# Patient Record
Sex: Female | Born: 1970 | Race: White | Hispanic: No | Marital: Married | Smoking: Never smoker
Health system: Southern US, Community
[De-identification: ages and names within clinical notes are randomized; demographics above are authoritative.]

## PROBLEM LIST (undated history)

## (undated) DIAGNOSIS — F1020 Alcohol dependence, uncomplicated: Secondary | ICD-10-CM

## (undated) DIAGNOSIS — E7212 Methylenetetrahydrofolate reductase deficiency: Secondary | ICD-10-CM

## (undated) DIAGNOSIS — E079 Disorder of thyroid, unspecified: Secondary | ICD-10-CM

## (undated) DIAGNOSIS — N951 Menopausal and female climacteric states: Secondary | ICD-10-CM

## (undated) DIAGNOSIS — W57XXXA Bitten or stung by nonvenomous insect and other nonvenomous arthropods, initial encounter: Secondary | ICD-10-CM

## (undated) DIAGNOSIS — F329 Major depressive disorder, single episode, unspecified: Secondary | ICD-10-CM

## (undated) DIAGNOSIS — R5382 Chronic fatigue, unspecified: Secondary | ICD-10-CM

## (undated) DIAGNOSIS — F419 Anxiety disorder, unspecified: Secondary | ICD-10-CM

## (undated) DIAGNOSIS — E7210 Disorders of sulfur-bearing amino-acid metabolism, unspecified: Secondary | ICD-10-CM

## (undated) HISTORY — DX: Chronic fatigue, unspecified: R53.82

## (undated) HISTORY — DX: Disorder of thyroid, unspecified: E07.9

## (undated) HISTORY — DX: Anxiety disorder, unspecified: F41.9

## (undated) HISTORY — DX: Alcohol dependence, uncomplicated: F10.20

## (undated) HISTORY — DX: Major depressive disorder, single episode, unspecified: F32.9

## (undated) HISTORY — DX: Disorders of sulfur-bearing amino-acid metabolism, unspecified: E72.10

## (undated) HISTORY — DX: Bitten or stung by nonvenomous insect and other nonvenomous arthropods, initial encounter: W57.XXXA

## (undated) HISTORY — DX: Menopausal and female climacteric states: N95.1

## (undated) HISTORY — DX: Methylenetetrahydrofolate reductase deficiency: E72.12

---
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
## 2001-09-28 ENCOUNTER — Other Ambulatory Visit: Admission: RE | Admit: 2001-09-28 | Discharge: 2001-09-28 | Payer: Self-pay | Admitting: Obstetrics and Gynecology

## 2001-10-11 HISTORY — PX: WISDOM TOOTH EXTRACTION: SHX21

## 2002-05-04 ENCOUNTER — Other Ambulatory Visit: Admission: RE | Admit: 2002-05-04 | Discharge: 2002-05-04 | Payer: Self-pay | Admitting: Obstetrics and Gynecology

## 2002-10-11 HISTORY — PX: ESSURE TUBAL LIGATION: SUR464

## 2002-10-16 ENCOUNTER — Other Ambulatory Visit: Admission: RE | Admit: 2002-10-16 | Discharge: 2002-10-16 | Payer: Self-pay | Admitting: Obstetrics and Gynecology

## 2003-03-14 ENCOUNTER — Other Ambulatory Visit: Admission: RE | Admit: 2003-03-14 | Discharge: 2003-03-14 | Payer: Self-pay | Admitting: Obstetrics and Gynecology

## 2003-04-08 ENCOUNTER — Ambulatory Visit (HOSPITAL_COMMUNITY): Admission: RE | Admit: 2003-04-08 | Discharge: 2003-04-08 | Payer: Self-pay | Admitting: *Deleted

## 2003-07-05 ENCOUNTER — Ambulatory Visit (HOSPITAL_COMMUNITY): Admission: RE | Admit: 2003-07-05 | Discharge: 2003-07-05 | Payer: Self-pay | Admitting: *Deleted

## 2003-07-05 ENCOUNTER — Encounter: Payer: Self-pay | Admitting: *Deleted

## 2004-07-27 ENCOUNTER — Other Ambulatory Visit: Admission: RE | Admit: 2004-07-27 | Discharge: 2004-07-27 | Payer: Self-pay | Admitting: Family Medicine

## 2007-10-12 HISTORY — PX: NOVASURE ABLATION: SHX5394

## 2009-10-11 HISTORY — PX: CHOLECYSTECTOMY: SHX55

## 2011-04-13 ENCOUNTER — Other Ambulatory Visit: Payer: Self-pay | Admitting: Obstetrics and Gynecology

## 2012-04-27 ENCOUNTER — Other Ambulatory Visit: Payer: Self-pay | Admitting: Obstetrics and Gynecology

## 2012-04-27 DIAGNOSIS — Z1231 Encounter for screening mammogram for malignant neoplasm of breast: Secondary | ICD-10-CM

## 2012-05-11 ENCOUNTER — Ambulatory Visit: Payer: Self-pay

## 2012-05-16 ENCOUNTER — Ambulatory Visit (INDEPENDENT_AMBULATORY_CARE_PROVIDER_SITE_OTHER): Payer: BC Managed Care – PPO

## 2012-05-16 DIAGNOSIS — Z1231 Encounter for screening mammogram for malignant neoplasm of breast: Secondary | ICD-10-CM

## 2012-10-11 HISTORY — PX: FINGER SURGERY: SHX640

## 2012-12-22 DIAGNOSIS — F32A Depression, unspecified: Secondary | ICD-10-CM

## 2012-12-22 HISTORY — DX: Depression, unspecified: F32.A

## 2013-04-27 ENCOUNTER — Other Ambulatory Visit: Payer: Self-pay | Admitting: Obstetrics and Gynecology

## 2013-04-27 DIAGNOSIS — Z1231 Encounter for screening mammogram for malignant neoplasm of breast: Secondary | ICD-10-CM

## 2013-05-22 ENCOUNTER — Ambulatory Visit (INDEPENDENT_AMBULATORY_CARE_PROVIDER_SITE_OTHER): Payer: BC Managed Care – PPO

## 2013-05-22 DIAGNOSIS — Z1231 Encounter for screening mammogram for malignant neoplasm of breast: Secondary | ICD-10-CM

## 2013-05-22 DIAGNOSIS — R928 Other abnormal and inconclusive findings on diagnostic imaging of breast: Secondary | ICD-10-CM

## 2013-05-24 ENCOUNTER — Other Ambulatory Visit: Payer: Self-pay | Admitting: Obstetrics and Gynecology

## 2013-05-24 DIAGNOSIS — R928 Other abnormal and inconclusive findings on diagnostic imaging of breast: Secondary | ICD-10-CM

## 2013-06-06 ENCOUNTER — Ambulatory Visit
Admission: RE | Admit: 2013-06-06 | Discharge: 2013-06-06 | Disposition: A | Discharge status: Home / Self Care | Payer: BC Managed Care – PPO | Source: Ambulatory Visit | Attending: Obstetrics and Gynecology | Admitting: Obstetrics and Gynecology

## 2013-06-06 ENCOUNTER — Other Ambulatory Visit: Payer: Self-pay | Admitting: Obstetrics and Gynecology

## 2013-06-06 DIAGNOSIS — R928 Other abnormal and inconclusive findings on diagnostic imaging of breast: Secondary | ICD-10-CM

## 2013-06-12 ENCOUNTER — Other Ambulatory Visit: Payer: BC Managed Care – PPO

## 2013-06-12 ENCOUNTER — Other Ambulatory Visit: Payer: Self-pay | Admitting: Obstetrics and Gynecology

## 2013-06-12 ENCOUNTER — Ambulatory Visit
Admission: RE | Admit: 2013-06-12 | Discharge: 2013-06-12 | Disposition: A | Discharge status: Home / Self Care | Payer: BC Managed Care – PPO | Source: Ambulatory Visit | Attending: Obstetrics and Gynecology | Admitting: Obstetrics and Gynecology

## 2013-06-12 DIAGNOSIS — R928 Other abnormal and inconclusive findings on diagnostic imaging of breast: Secondary | ICD-10-CM

## 2013-06-13 ENCOUNTER — Ambulatory Visit
Admission: RE | Admit: 2013-06-13 | Discharge: 2013-06-13 | Disposition: A | Discharge status: Home / Self Care | Payer: BC Managed Care – PPO | Source: Ambulatory Visit | Attending: Obstetrics and Gynecology | Admitting: Obstetrics and Gynecology

## 2013-06-13 DIAGNOSIS — R928 Other abnormal and inconclusive findings on diagnostic imaging of breast: Secondary | ICD-10-CM

## 2013-06-19 ENCOUNTER — Encounter (INDEPENDENT_AMBULATORY_CARE_PROVIDER_SITE_OTHER): Payer: Self-pay | Admitting: General Surgery

## 2013-06-19 ENCOUNTER — Ambulatory Visit (INDEPENDENT_AMBULATORY_CARE_PROVIDER_SITE_OTHER): Payer: BC Managed Care – PPO | Admitting: General Surgery

## 2013-06-19 VITALS — BP 110/68 | HR 64 | Resp 14 | Ht 67.0 in | Wt 148.0 lb

## 2013-06-19 DIAGNOSIS — N63 Unspecified lump in unspecified breast: Secondary | ICD-10-CM

## 2013-06-19 DIAGNOSIS — N6310 Unspecified lump in the right breast, unspecified quadrant: Secondary | ICD-10-CM

## 2013-06-19 NOTE — Progress Notes (Signed)
Patient ID: Amber Barajas, female   DOB: 1970/12/20, 42 y.o.   MRN: [id number]  Chief Complaint  Patient presents with  . New Evaluation    eval rt breast discordant    HPI Amber Barajas is a 42 y.o. female.  We are asked to see the patient in consultation by Dr. Judyann Munson to evaluate her for a right breast mass. The patient is a 42 year old white female who recently went for a routine screening mammogram. At that time she was not having any breast pain or discharge from the nipple. An abnormality was found in the 6:00 position of the right breast. This was suspicious appearing. It was biopsied and came back as fibrocystic disease. The radiologist felt this finding was discordant. She has no personal or family history of breast issues.  HPI  Past Medical History  Diagnosis Date  . Thyroid disease     Past Surgical History  Procedure Laterality Date  . Cholecystectomy    . Novasure ablation    . Essure tubal ligation      Family History  Problem Relation Age of Onset  . Cancer Father     lung  . Cancer Paternal Uncle     brain    Social History History  Substance Use Topics  . Smoking status: Never Smoker   . Smokeless tobacco: Never Used  . Alcohol Use: No    Allergies  Allergen Reactions  . Penicillins Hives and Itching    Current Outpatient Prescriptions  Medication Sig Dispense Refill  . folic acid (FOLVITE) 400 MCG tablet Take 800 mcg by mouth daily.      . Multiple Vitamins-Minerals (MULTIVITAMIN WITH MINERALS) tablet Take 1 tablet by mouth daily.      Marland Kitchen NATURE-THROID 81.25 MG TABS       . thiamine 50 MG tablet Take 50 mg by mouth daily.      . Vitamin D, Ergocalciferol, (DRISDOL) 50000 UNITS CAPS capsule Take 50,000 Units by mouth.      . vitamin E 400 UNIT capsule Take 400 Units by mouth daily.       No current facility-administered medications for this visit.    Review of Systems Review of Systems  Constitutional: Negative.   HENT: Negative.    Eyes: Negative.   Respiratory: Negative.   Cardiovascular: Negative.   Gastrointestinal: Negative.   Endocrine: Negative.   Genitourinary: Negative.   Musculoskeletal: Negative.   Skin: Negative.   Allergic/Immunologic: Negative.   Neurological: Negative.   Hematological: Negative.   Psychiatric/Behavioral: Negative.     Blood pressure 110/68, pulse 64, resp. rate 14, height 5\' 7"  (1.702 m), weight 148 lb (67.132 kg).  Physical Exam Physical Exam  Constitutional: She is oriented to person, place, and time. She appears well-developed and well-nourished.  HENT:  Head: Normocephalic and atraumatic.  Eyes: Conjunctivae and EOM are normal. Pupils are equal, round, and reactive to light.  Neck: Normal range of motion. Neck supple.  Cardiovascular: Normal rate, regular rhythm and normal heart sounds.   Pulmonary/Chest: Effort normal and breath sounds normal.  There is a palpable 1.5 cm mass in the 6:00 position the right breast it is mobile. There is no palpable mass in the left breast. She does have dense breast tissue bilaterally. There is no palpable axillary, cervical, or supraclavicular lymphadenopathy.  Abdominal: Soft. Bowel sounds are normal. She exhibits no mass. There is no tenderness.  Musculoskeletal: Normal range of motion.  Lymphadenopathy:    She has no cervical adenopathy.  Neurological: She is alert and oriented to person, place, and time.  Skin: Skin is warm and dry.  Psychiatric: She has a normal mood and affect. Her behavior is normal.    Data Reviewed As above  Assessment    The patient has a palpable mass in the 6:00 position of the right breast that has a worrisome appearance but a benign diagnosis on core needle biopsy. Because of the discord and so the results the recommendation would be to have this lump removed to make sure definitively that it is not a breast cancer. I've discussed with her in detail the risks and benefits of the operation to do this as  well as some of the technical aspects and she understands and wishes to proceed     Plan    Plan for right breast lumpectomy        TOTH III,Ramzey Petrovic S 06/19/2013, 12:29 PM

## 2013-06-19 NOTE — Patient Instructions (Signed)
Plan for right breast lumpectomy 

## 2013-06-28 ENCOUNTER — Inpatient Hospital Stay: Admission: RE | Admit: 2013-06-28 | Payer: BC Managed Care – PPO | Source: Ambulatory Visit

## 2013-07-09 LAB — HM PAP SMEAR: HM PAP: NORMAL

## 2013-07-27 ENCOUNTER — Encounter (HOSPITAL_BASED_OUTPATIENT_CLINIC_OR_DEPARTMENT_OTHER): Payer: Self-pay | Admitting: *Deleted

## 2013-07-27 NOTE — Progress Notes (Signed)
No labs needed

## 2013-08-01 ENCOUNTER — Ambulatory Visit (HOSPITAL_BASED_OUTPATIENT_CLINIC_OR_DEPARTMENT_OTHER)
Admission: RE | Admit: 2013-08-01 | Discharge: 2013-08-01 | Disposition: A | Discharge status: Home / Self Care | Payer: BC Managed Care – PPO | Source: Ambulatory Visit | Attending: General Surgery | Admitting: General Surgery

## 2013-08-01 ENCOUNTER — Encounter (HOSPITAL_BASED_OUTPATIENT_CLINIC_OR_DEPARTMENT_OTHER): Payer: Self-pay | Admitting: *Deleted

## 2013-08-01 ENCOUNTER — Encounter (HOSPITAL_BASED_OUTPATIENT_CLINIC_OR_DEPARTMENT_OTHER)
Admission: RE | Disposition: A | Discharge status: Home / Self Care | Payer: Self-pay | Source: Ambulatory Visit | Attending: General Surgery

## 2013-08-01 ENCOUNTER — Encounter (HOSPITAL_BASED_OUTPATIENT_CLINIC_OR_DEPARTMENT_OTHER): Payer: BC Managed Care – PPO | Admitting: Anesthesiology

## 2013-08-01 ENCOUNTER — Ambulatory Visit (HOSPITAL_BASED_OUTPATIENT_CLINIC_OR_DEPARTMENT_OTHER): Payer: BC Managed Care – PPO | Admitting: Anesthesiology

## 2013-08-01 DIAGNOSIS — N6089 Other benign mammary dysplasias of unspecified breast: Secondary | ICD-10-CM

## 2013-08-01 DIAGNOSIS — R920 Mammographic microcalcification found on diagnostic imaging of breast: Secondary | ICD-10-CM

## 2013-08-01 DIAGNOSIS — N6019 Diffuse cystic mastopathy of unspecified breast: Secondary | ICD-10-CM

## 2013-08-01 DIAGNOSIS — Z79899 Other long term (current) drug therapy: Secondary | ICD-10-CM | POA: Insufficient documentation

## 2013-08-01 DIAGNOSIS — N6310 Unspecified lump in the right breast, unspecified quadrant: Secondary | ICD-10-CM

## 2013-08-01 DIAGNOSIS — E079 Disorder of thyroid, unspecified: Secondary | ICD-10-CM | POA: Insufficient documentation

## 2013-08-01 HISTORY — PX: BREAST LUMPECTOMY: SHX2

## 2013-08-01 SURGERY — BREAST LUMPECTOMY
Anesthesia: General | Site: Breast | Laterality: Right | Wound class: Clean

## 2013-08-01 MED ORDER — LIDOCAINE HCL (CARDIAC) 20 MG/ML IV SOLN
INTRAVENOUS | Status: DC | PRN
  Administered 2013-08-01: 80 mg via INTRAVENOUS

## 2013-08-01 MED ORDER — HYDROMORPHONE HCL PF 1 MG/ML IJ SOLN
0.2500 mg | INTRAMUSCULAR | Status: DC | PRN
  Administered 2013-08-01 (×2): 0.5 mg via INTRAVENOUS

## 2013-08-01 MED ORDER — OXYCODONE HCL 5 MG PO TABS
ORAL_TABLET | ORAL | Status: AC
  Filled 2013-08-01: qty 1

## 2013-08-01 MED ORDER — MIDAZOLAM HCL 2 MG/2ML IJ SOLN
INTRAMUSCULAR | Status: AC
  Filled 2013-08-01: qty 2

## 2013-08-01 MED ORDER — ONDANSETRON HCL 4 MG/2ML IJ SOLN
INTRAMUSCULAR | Status: DC | PRN
  Administered 2013-08-01: 4 mg via INTRAMUSCULAR

## 2013-08-01 MED ORDER — CHLORHEXIDINE GLUCONATE 4 % EX LIQD: 1.0000 "application " | Freq: Once | CUTANEOUS | Status: DC

## 2013-08-01 MED ORDER — MIDAZOLAM HCL 5 MG/5ML IJ SOLN
INTRAMUSCULAR | Status: DC | PRN
  Administered 2013-08-01: 2 mg via INTRAVENOUS

## 2013-08-01 MED ORDER — PROPOFOL 10 MG/ML IV BOLUS
INTRAVENOUS | Status: DC | PRN
  Administered 2013-08-01 (×2): 20 mg via INTRAVENOUS
  Administered 2013-08-01: 150 mg via INTRAVENOUS
  Administered 2013-08-01: 20 mg via INTRAVENOUS

## 2013-08-01 MED ORDER — OXYCODONE HCL 5 MG/5ML PO SOLN: 5.0000 mg | Freq: Once | ORAL | Status: AC | PRN

## 2013-08-01 MED ORDER — BUPIVACAINE HCL (PF) 0.25 % IJ SOLN
INTRAMUSCULAR | Status: DC | PRN
  Administered 2013-08-01: 10 mL

## 2013-08-01 MED ORDER — DEXAMETHASONE SODIUM PHOSPHATE 4 MG/ML IJ SOLN
INTRAMUSCULAR | Status: DC | PRN
  Administered 2013-08-01: 10 mg via INTRAVENOUS

## 2013-08-01 MED ORDER — FENTANYL CITRATE 0.05 MG/ML IJ SOLN: 50.0000 ug | Freq: Once | INTRAMUSCULAR | Status: DC

## 2013-08-01 MED ORDER — VANCOMYCIN HCL IN DEXTROSE 1-5 GM/200ML-% IV SOLN
1000.0000 mg | INTRAVENOUS | Status: AC
  Administered 2013-08-01: 1000 mg via INTRAVENOUS

## 2013-08-01 MED ORDER — FENTANYL CITRATE 0.05 MG/ML IJ SOLN
INTRAMUSCULAR | Status: AC
  Filled 2013-08-01: qty 2

## 2013-08-01 MED ORDER — FENTANYL CITRATE 0.05 MG/ML IJ SOLN
INTRAMUSCULAR | Status: DC | PRN
  Administered 2013-08-01: 100 ug via INTRAVENOUS

## 2013-08-01 MED ORDER — FENTANYL CITRATE 0.05 MG/ML IJ SOLN: 50.0000 ug | INTRAMUSCULAR | Status: DC | PRN

## 2013-08-01 MED ORDER — MIDAZOLAM HCL 2 MG/2ML IJ SOLN: 1.0000 mg | INTRAMUSCULAR | Status: DC | PRN

## 2013-08-01 MED ORDER — OXYCODONE HCL 5 MG PO TABS
5.0000 mg | ORAL_TABLET | Freq: Once | ORAL | Status: AC | PRN
  Administered 2013-08-01: 5 mg via ORAL

## 2013-08-01 MED ORDER — BUPIVACAINE-EPINEPHRINE PF 0.25-1:200000 % IJ SOLN
INTRAMUSCULAR | Status: AC
  Filled 2013-08-01: qty 30

## 2013-08-01 MED ORDER — HYDROMORPHONE HCL PF 1 MG/ML IJ SOLN
INTRAMUSCULAR | Status: AC
  Filled 2013-08-01: qty 1

## 2013-08-01 MED ORDER — BUPIVACAINE HCL (PF) 0.25 % IJ SOLN
INTRAMUSCULAR | Status: AC
  Filled 2013-08-01: qty 30

## 2013-08-01 MED ORDER — HYDROCODONE-ACETAMINOPHEN 5-325 MG PO TABS: 1.0000 | ORAL_TABLET | ORAL | Status: DC | PRN

## 2013-08-01 MED ORDER — VANCOMYCIN HCL IN DEXTROSE 1-5 GM/200ML-% IV SOLN
INTRAVENOUS | Status: AC
  Filled 2013-08-01: qty 200

## 2013-08-01 MED ORDER — 0.9 % SODIUM CHLORIDE (POUR BTL) OPTIME
TOPICAL | Status: DC | PRN
  Administered 2013-08-01: 100 mL

## 2013-08-01 MED ORDER — PROMETHAZINE HCL 25 MG/ML IJ SOLN: 6.2500 mg | INTRAMUSCULAR | Status: DC | PRN

## 2013-08-01 MED ORDER — LACTATED RINGERS IV SOLN
INTRAVENOUS | Status: DC
  Administered 2013-08-01: 08:00:00 via INTRAVENOUS

## 2013-08-01 MED ORDER — BUPIVACAINE-EPINEPHRINE PF 0.5-1:200000 % IJ SOLN
INTRAMUSCULAR | Status: AC
  Filled 2013-08-01: qty 30

## 2013-08-01 SURGICAL SUPPLY — 41 items
ADH SKN CLS APL DERMABOND .7 (GAUZE/BANDAGES/DRESSINGS) ×1
BLADE SURG 10 STRL SS (BLADE) ×2 IMPLANT
BLADE SURG 15 STRL LF DISP TIS (BLADE) ×1 IMPLANT
BLADE SURG 15 STRL SS (BLADE) ×2
CANISTER SUCT 1200ML W/VALVE (MISCELLANEOUS) ×2 IMPLANT
CHLORAPREP W/TINT 26ML (MISCELLANEOUS) ×2 IMPLANT
CLIP TI WIDE RED SMALL 6 (CLIP) IMPLANT
COVER MAYO STAND STRL (DRAPES) ×2 IMPLANT
COVER TABLE BACK 60X90 (DRAPES) ×2 IMPLANT
DECANTER SPIKE VIAL GLASS SM (MISCELLANEOUS) ×1 IMPLANT
DERMABOND ADVANCED (GAUZE/BANDAGES/DRESSINGS) ×1
DERMABOND ADVANCED .7 DNX12 (GAUZE/BANDAGES/DRESSINGS) ×1 IMPLANT
DEVICE DUBIN W/COMP PLATE 8390 (MISCELLANEOUS) IMPLANT
DRAPE LAPAROSCOPIC ABDOMINAL (DRAPES) ×2 IMPLANT
DRAPE UTILITY XL STRL (DRAPES) ×2 IMPLANT
ELECT COATED BLADE 2.86 ST (ELECTRODE) ×2 IMPLANT
ELECT REM PT RETURN 9FT ADLT (ELECTROSURGICAL) ×2
ELECTRODE REM PT RTRN 9FT ADLT (ELECTROSURGICAL) ×1 IMPLANT
GLOVE BIO SURGEON STRL SZ7.5 (GLOVE) ×2 IMPLANT
GLOVE BIOGEL PI IND STRL 7.0 (GLOVE) IMPLANT
GLOVE BIOGEL PI INDICATOR 7.0 (GLOVE) ×1
GLOVE ECLIPSE 6.5 STRL STRAW (GLOVE) ×2 IMPLANT
GOWN PREVENTION PLUS XLARGE (GOWN DISPOSABLE) ×4 IMPLANT
NDL HYPO 25X1 1.5 SAFETY (NEEDLE) ×1 IMPLANT
NEEDLE HYPO 25X1 1.5 SAFETY (NEEDLE) ×2 IMPLANT
NS IRRIG 1000ML POUR BTL (IV SOLUTION) ×2 IMPLANT
PACK BASIN DAY SURGERY FS (CUSTOM PROCEDURE TRAY) ×2 IMPLANT
PENCIL BUTTON HOLSTER BLD 10FT (ELECTRODE) ×2 IMPLANT
SLEEVE SCD COMPRESS KNEE MED (MISCELLANEOUS) ×2 IMPLANT
SPONGE LAP 18X18 X RAY DECT (DISPOSABLE) ×2 IMPLANT
STAPLER VISISTAT 35W (STAPLE) IMPLANT
SUT MON AB 4-0 PC3 18 (SUTURE) ×2 IMPLANT
SUT SILK 2 0 SH (SUTURE) ×2 IMPLANT
SUT VIC AB 3-0 54X BRD REEL (SUTURE) IMPLANT
SUT VIC AB 3-0 BRD 54 (SUTURE)
SUT VICRYL 3-0 CR8 SH (SUTURE) ×2 IMPLANT
SYR CONTROL 10ML LL (SYRINGE) ×2 IMPLANT
TOWEL OR 17X24 6PK STRL BLUE (TOWEL DISPOSABLE) ×2 IMPLANT
TOWEL OR NON WOVEN STRL DISP B (DISPOSABLE) ×2 IMPLANT
TUBE CONNECTING 20X1/4 (TUBING) ×2 IMPLANT
YANKAUER SUCT BULB TIP NO VENT (SUCTIONS) ×2 IMPLANT

## 2013-08-01 NOTE — Op Note (Signed)
08/01/2013  9:21 AM  PATIENT:  Amber Barajas  42 y.o. female  PRE-OPERATIVE DIAGNOSIS:  right breast mass   POST-OPERATIVE DIAGNOSIS:  right breast mass   PROCEDURE:  Procedure(s): LUMPECTOMY (Right)  SURGEON:  Surgeon(s) and Role:    * Robyne Askew, MD - Primary  PHYSICIAN ASSISTANT:   ASSISTANTS: none   ANESTHESIA:   general  EBL:     BLOOD ADMINISTERED:none  DRAINS: none   LOCAL MEDICATIONS USED:  MARCAINE     SPECIMEN:  Source of Specimen:  right breast tissue  DISPOSITION OF SPECIMEN:  PATHOLOGY  COUNTS:  YES  TOURNIQUET:  * No tourniquets in log *  DICTATION: .Dragon Dictation After informed consent was obtained the patient was brought to the operating room placed in the supine position on the operating room table. After adequate induction of general anesthesia the patient's right breast was prepped with ChloraPrep, allowed to dry, and draped in usual sterile manner. The patient has a palpable mass in the 6:00 position of the right breast that was needle biopsied but the results were discordant from the radiologic findings. Because of this the decision was made to remove the mass. A radial type incision was made with a 15 blade knife overlying the mass. This incision was carried through the skin and subcutaneous tissue sharply with electrocautery. Once into the breast tissue the mass could be palpated very easily. A circular portion of breast tissue was excised sharply around the mass using the electrocautery. Once the mass was removed it was oriented with a short stitch on the superior surface of the long stitch on the lateral surface. The mass was then sent to pathology for further evaluation. No other palpable masses were felt through the incision. Hemostasis was achieved using the Bovie electrocautery. The wound was infiltrated with quarter percent Marcaine. The deep layer the wound was then closed with interrupted 3-0 Vicryl stitches. The skin was then closed  with interrupted 4-0 Monocryl subcuticular stitches. Dermabond dressings were applied. The patient tolerated the procedure well. At the end of the case all needle sponge and instrument counts were correct. The patient was then awakened and taken to recovery in stable condition.  PLAN OF CARE: Discharge to home after PACU  PATIENT DISPOSITION:  PACU - hemodynamically stable.   Delay start of Pharmacological VTE agent (>24hrs) due to surgical blood loss or risk of bleeding: not applicable

## 2013-08-01 NOTE — Transfer of Care (Signed)
Immediate Anesthesia Transfer of Care Note  Patient: Amber Barajas  Procedure(s) Performed: Procedure(s): LUMPECTOMY (Right)  Patient Location: PACU  Anesthesia Type:General  Level of Consciousness: awake, sedated and patient cooperative  Airway & Oxygen Therapy: Patient Spontanous Breathing and Patient connected to face mask oxygen  Post-op Assessment: Report given to PACU RN and Post -op Vital signs reviewed and stable  Post vital signs: Reviewed and stable  Complications: No apparent anesthesia complications

## 2013-08-01 NOTE — Anesthesia Procedure Notes (Signed)
Procedure Name: LMA Insertion Date/Time: 08/01/2013 8:43 AM Performed by: Gar Gibbon Pre-anesthesia Checklist: Patient identified, Emergency Drugs available, Suction available and Patient being monitored Patient Re-evaluated:Patient Re-evaluated prior to inductionOxygen Delivery Method: Circle System Utilized Preoxygenation: Pre-oxygenation with 100% oxygen Intubation Type: IV induction Ventilation: Mask ventilation without difficulty LMA: LMA inserted LMA Size: 3.0 Number of attempts: 2 Airway Equipment and Method: bite block Placement Confirmation: positive ETCO2 Tube secured with: Tape Dental Injury: Teeth and Oropharynx as per pre-operative assessment

## 2013-08-01 NOTE — Anesthesia Postprocedure Evaluation (Signed)
  Anesthesia Post-op Note  Patient: Amber Barajas  Procedure(s) Performed: Procedure(s): LUMPECTOMY (Right)  Patient Location: PACU  Anesthesia Type:General  Level of Consciousness: awake and alert   Airway and Oxygen Therapy: Patient Spontanous Breathing  Post-op Pain: mild  Post-op Assessment: Post-op Vital signs reviewed, Patient's Cardiovascular Status Stable, Respiratory Function Stable, Patent Airway, No signs of Nausea or vomiting and Pain level controlled  Post-op Vital Signs: Reviewed and stable  Complications: No apparent anesthesia complications

## 2013-08-01 NOTE — Interval H&P Note (Signed)
History and Physical Interval Note:  08/01/2013 8:27 AM  Amber Barajas  has presented today for surgery, with the diagnosis of right breast mass   The various methods of treatment have been discussed with the patient and family. After consideration of risks, benefits and other options for treatment, the patient has consented to  Procedure(s): LUMPECTOMY (Right) as a surgical intervention .  The patient's history has been reviewed, patient examined, no change in status, stable for surgery.  I have reviewed the patient's chart and labs.  Questions were answered to the patient's satisfaction.     TOTH III,Merlyn Bollen S

## 2013-08-01 NOTE — H&P (Signed)
Amber Barajas  06/19/2013 11:30 AM   Office Visit  MRN:  [id number]   Description: 42 year old female  Provider: Robyne Askew, MD  Department: [facility]        Diagnoses    Breast mass, right    -  Primary    611.72      Reason for Visit    New Evaluation    eval rt breast discordant        Current Vitals - Last Recorded    BP Pulse Resp Ht Wt BMI    110/68 64 14 5\' 7"  (1.702 m) 148 lb (67.132 kg) 23.17 kg/m2       LMP              07/11/2013                 Progress Notes    Robyne Askew, MD at 06/19/2013 12:32 PM    Status: Signed                   Patient ID: Amber Barajas, female   DOB: July 27, 1971, 42 y.o.   MRN: [id number]    Chief Complaint   Patient presents with   .  New Evaluation       eval rt breast discordant        HPI Amber Barajas is a 42 y.o. female.  We are asked to see the patient in consultation by Dr. Judyann Munson to evaluate her for a right breast mass. The patient is a 42 year old white female who recently went for a routine screening mammogram. At that time she was not having any breast pain or discharge from the nipple. An abnormality was found in the 6:00 position of the right breast. This was suspicious appearing. It was biopsied and came back as fibrocystic disease. The radiologist felt this finding was discordant. She has no personal or family history of breast issues.  HPI    Past Medical History   Diagnosis  Date   .  Thyroid disease           Past Surgical History   Procedure  Laterality  Date   .  Cholecystectomy       .  Novasure ablation       .  Essure tubal ligation             Family History   Problem  Relation  Age of Onset   .  Cancer  Father         lung   .  Cancer  Paternal Uncle         brain        Social History History   Substance Use Topics   .  Smoking status:  Never Smoker    .  Smokeless tobacco:  Never Used   .  Alcohol Use:  No         Allergies   Allergen   Reactions   .  Penicillins  Hives and Itching         Current Outpatient Prescriptions   Medication  Sig  Dispense  Refill   .  folic acid (FOLVITE) 400 MCG tablet  Take 800 mcg by mouth daily.         .  Multiple Vitamins-Minerals (MULTIVITAMIN WITH MINERALS) tablet  Take 1 tablet by mouth daily.         Marland Kitchen  NATURE-THROID 81.25 MG TABS           .  thiamine 50 MG tablet  Take 50 mg by mouth daily.         .  Vitamin D, Ergocalciferol, (DRISDOL) 50000 UNITS CAPS capsule  Take 50,000 Units by mouth.         .  vitamin E 400 UNIT capsule  Take 400 Units by mouth daily.             No current facility-administered medications for this visit.        Review of Systems Review of Systems  Constitutional: Negative.   HENT: Negative.   Eyes: Negative.   Respiratory: Negative.   Cardiovascular: Negative.   Gastrointestinal: Negative.   Endocrine: Negative.   Genitourinary: Negative.   Musculoskeletal: Negative.   Skin: Negative.   Allergic/Immunologic: Negative.   Neurological: Negative.   Hematological: Negative.   Psychiatric/Behavioral: Negative.       Blood pressure 110/68, pulse 64, resp. rate 14, height 5\' 7"  (1.702 m), weight 148 lb (67.132 kg).   Physical Exam Physical Exam  Constitutional: She is oriented to person, place, and time. She appears well-developed and well-nourished.  HENT:   Head: Normocephalic and atraumatic.  Eyes: Conjunctivae and EOM are normal. Pupils are equal, round, and reactive to light.  Neck: Normal range of motion. Neck supple.  Cardiovascular: Normal rate, regular rhythm and normal heart sounds.   Pulmonary/Chest: Effort normal and breath sounds normal.  There is a palpable 1.5 cm mass in the 6:00 position the right breast it is mobile. There is no palpable mass in the left breast. She does have dense breast tissue bilaterally. There is no palpable axillary, cervical, or supraclavicular lymphadenopathy.  Abdominal: Soft. Bowel sounds are  normal. She exhibits no mass. There is no tenderness.  Musculoskeletal: Normal range of motion.  Lymphadenopathy:    She has no cervical adenopathy.  Neurological: She is alert and oriented to person, place, and time.  Skin: Skin is warm and dry.  Psychiatric: She has a normal mood and affect. Her behavior is normal.      Data Reviewed As above   Assessment    The patient has a palpable mass in the 6:00 position of the right breast that has a worrisome appearance but a benign diagnosis on core needle biopsy. Because of the discord and so the results the recommendation would be to have this lump removed to make sure definitively that it is not a breast cancer. I've discussed with her in detail the risks and benefits of the operation to do this as well as some of the technical aspects and she understands and wishes to proceed      Plan    Plan for right breast lumpectomy

## 2013-08-01 NOTE — Anesthesia Preprocedure Evaluation (Addendum)
Anesthesia Evaluation  Patient identified by MRN, date of birth, ID band Patient awake    Reviewed: Allergy & Precautions, H&P , NPO status , Patient's Chart, lab work & pertinent test results  Airway Mallampati: I TM Distance: >3 FB Neck ROM: Full    Dental   Pulmonary  breath sounds clear to auscultation        Cardiovascular Rhythm:Regular Rate:Normal     Neuro/Psych    GI/Hepatic   Endo/Other    Renal/GU      Musculoskeletal   Abdominal   Peds  Hematology   Anesthesia Other Findings   Reproductive/Obstetrics                           Anesthesia Physical Anesthesia Plan  ASA: I  Anesthesia Plan: General   Post-op Pain Management:    Induction:   Airway Management Planned: LMA  Additional Equipment:   Intra-op Plan:   Post-operative Plan: Extubation in OR  Informed Consent: I have reviewed the patients History and Physical, chart, labs and discussed the procedure including the risks, benefits and alternatives for the proposed anesthesia with the patient or authorized representative who has indicated his/her understanding and acceptance.     Plan Discussed with: CRNA and Surgeon  Anesthesia Plan Comments:         Anesthesia Quick Evaluation

## 2013-08-03 ENCOUNTER — Encounter (HOSPITAL_BASED_OUTPATIENT_CLINIC_OR_DEPARTMENT_OTHER): Payer: Self-pay | Admitting: General Surgery

## 2013-08-06 ENCOUNTER — Telehealth (INDEPENDENT_AMBULATORY_CARE_PROVIDER_SITE_OTHER): Payer: Self-pay

## 2013-08-06 NOTE — Telephone Encounter (Signed)
Pt called and was given pathology results.

## 2013-08-08 ENCOUNTER — Telehealth (INDEPENDENT_AMBULATORY_CARE_PROVIDER_SITE_OTHER): Payer: Self-pay

## 2013-08-08 NOTE — Telephone Encounter (Signed)
Pt called back stating she was told Dr Carolynne Edouard would call her with more information about her pathology report. I told her path showed a sclerosing lesion with fibrocystic changes. Advised it was benign tissue and that there was nothing more surgically to do. She asked if there would be any further follow up besides her post op appointment. Advised not at this time but she would need her regular mammograms. Advised Dr Carolynne Edouard will go over all this at her appointment.

## 2013-08-16 ENCOUNTER — Other Ambulatory Visit: Payer: Self-pay

## 2013-08-22 ENCOUNTER — Encounter (INDEPENDENT_AMBULATORY_CARE_PROVIDER_SITE_OTHER): Payer: Self-pay | Admitting: General Surgery

## 2013-08-22 ENCOUNTER — Ambulatory Visit (INDEPENDENT_AMBULATORY_CARE_PROVIDER_SITE_OTHER): Payer: BC Managed Care – PPO | Admitting: General Surgery

## 2013-08-22 VITALS — BP 124/72 | HR 76 | Resp 16 | Ht 67.0 in | Wt 153.8 lb

## 2013-08-22 DIAGNOSIS — N63 Unspecified lump in unspecified breast: Secondary | ICD-10-CM

## 2013-08-22 DIAGNOSIS — N6310 Unspecified lump in the right breast, unspecified quadrant: Secondary | ICD-10-CM

## 2013-08-22 NOTE — Patient Instructions (Signed)
Continue regular self exams  

## 2013-08-22 NOTE — Progress Notes (Signed)
Subjective:     Patient ID: Amber Barajas, female   DOB: July 16, 1971, 42 y.o.   MRN: [id number]  HPI The patient is a 42 year old white female who is about 3 weeks status post right lumpectomy. 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 She tolerated the surgery well and has no complaints today. She denies any pain.  Review of Systems     Objective:   Physical Exam On exam her right breast incision in the 6:00 position is healing nicely with no sign of infection or significant  seroma    Assessment:     The patient is 3 weeks status post right lumpectomy for benign disease     Plan:     At this point she will continue to do regular self exams. She will continue to followup with her medical doctor yearly and get a yearly mammogram. We will plan to see her back on a when necessary basis. I have offered to have her see Dr. Park Breed and the [facility] but is not interested at this time.

## 2013-12-20 ENCOUNTER — Ambulatory Visit (INDEPENDENT_AMBULATORY_CARE_PROVIDER_SITE_OTHER): Payer: BC Managed Care – PPO

## 2013-12-20 ENCOUNTER — Encounter: Payer: Self-pay | Admitting: Sports Medicine

## 2013-12-20 ENCOUNTER — Ambulatory Visit (INDEPENDENT_AMBULATORY_CARE_PROVIDER_SITE_OTHER): Payer: BC Managed Care – PPO | Admitting: Sports Medicine

## 2013-12-20 VITALS — BP 114/73 | HR 80 | Ht 67.0 in | Wt 163.0 lb

## 2013-12-20 DIAGNOSIS — M25561 Pain in right knee: Secondary | ICD-10-CM

## 2013-12-20 DIAGNOSIS — M25569 Pain in unspecified knee: Secondary | ICD-10-CM

## 2013-12-20 DIAGNOSIS — M1711 Unilateral primary osteoarthritis, right knee: Secondary | ICD-10-CM | POA: Insufficient documentation

## 2013-12-20 MED ORDER — MELOXICAM 15 MG PO TABS: ORAL_TABLET | ORAL | Status: DC

## 2013-12-20 NOTE — Assessment & Plan Note (Signed)
For over a decade now, moderate, persistent, wakes her up from sleep, with morning jgelling. She does have pain over the lateral femoral condyle and lateral joint line without mechanical symptoms suggestive of a degenerative meniscal injury as well. She does desire aggressive treatment, injection as above, for physical therapy, Mobic, x-rays. Return to see me in one month.

## 2013-12-20 NOTE — Progress Notes (Signed)
   Subjective:    I'm seeing this patient as a consultation for:  Amber Ghent, PA-C with [facility]  CC: Right knee pain  HPI: Amber Barajas is a very pleasant 43 year old female, for the past decade or 2 she's had pain that she localizes under the kneecap and along the lateral joint line. Pain is moderate, persistent, it is stiff in the mornings, and it keeps her from exercising. She denies any trauma.  Past medical history, Surgical history, Family history not pertinant except as noted below, Social history, Allergies, and medications have been entered into the medical record, reviewed, and no changes needed.   Review of Systems: No headache, visual changes, nausea, vomiting, diarrhea, constipation, dizziness, abdominal pain, skin rash, fevers, chills, night sweats, weight loss, swollen lymph nodes, body aches, joint swelling, muscle aches, chest pain, shortness of breath, mood changes, visual or auditory hallucinations.   Objective:   General: Well Developed, well nourished, and in no acute distress.  Neuro/Psych: Alert and oriented x3, extra-ocular muscles intact, able to move all 4 extremities, sensation grossly intact. Skin: Warm and dry, no rashes noted.  Respiratory: Not using accessory muscles, speaking in full sentences, trachea midline.  Cardiovascular: Pulses palpable, no extremity edema. Abdomen: Does not appear distended. Right Knee: Normal to inspection with no erythema or effusion or obvious bony abnormalities. Tender palpation along the medial and lateral joint lines. ROM full in flexion and extension and lower leg rotation. Ligaments with solid consistent endpoints including ACL, PCL, LCL, MCL. Negative Mcmurray's, Apley's, and Thessalonian tests. Non painful patellar compression. Patellar glide without crepitus. Patellar and quadriceps tendons unremarkable. Hamstring and quadriceps strength is normal.   On x-rays, Amber Barajas view, there does appear  to be mildly decreased joint space in the medial compartment of the right knee compared to the left side.  Procedure: Real-time Ultrasound Guided Injection of right knee Device: GE Logiq E  Verbal informed consent obtained.  Time-out conducted.  Noted no overlying erythema, induration, or other signs of local infection.  Skin prepped in a sterile fashion.  Local anesthesia: Topical Ethyl chloride.  With sterile technique and under real time ultrasound guidance:  1 cc Kenalog 40, 4 cc lidocaine injected easily into the suprapatellar recess. Completed without difficulty  Pain immediately resolved suggesting accurate placement of the medication.  Advised to call if fevers/chills, erythema, induration, drainage, or persistent bleeding.  Images permanently stored and available for review in the ultrasound unit.  Impression: Technically successful ultrasound guided injection.  Impression and Recommendations:   This case required medical decision making of moderate complexity.

## 2013-12-25 ENCOUNTER — Ambulatory Visit (INDEPENDENT_AMBULATORY_CARE_PROVIDER_SITE_OTHER): Payer: BC Managed Care – PPO | Admitting: Physical Therapy

## 2013-12-25 DIAGNOSIS — M25569 Pain in unspecified knee: Secondary | ICD-10-CM

## 2013-12-28 ENCOUNTER — Encounter (INDEPENDENT_AMBULATORY_CARE_PROVIDER_SITE_OTHER): Payer: BC Managed Care – PPO | Admitting: Physical Therapy

## 2013-12-28 DIAGNOSIS — M25569 Pain in unspecified knee: Secondary | ICD-10-CM

## 2013-12-31 ENCOUNTER — Encounter (INDEPENDENT_AMBULATORY_CARE_PROVIDER_SITE_OTHER): Payer: BC Managed Care – PPO

## 2013-12-31 DIAGNOSIS — M25569 Pain in unspecified knee: Secondary | ICD-10-CM

## 2014-01-02 ENCOUNTER — Encounter (INDEPENDENT_AMBULATORY_CARE_PROVIDER_SITE_OTHER): Payer: BC Managed Care – PPO

## 2014-01-08 ENCOUNTER — Encounter (INDEPENDENT_AMBULATORY_CARE_PROVIDER_SITE_OTHER): Payer: BC Managed Care – PPO | Admitting: Physical Therapy

## 2014-01-08 DIAGNOSIS — M25569 Pain in unspecified knee: Secondary | ICD-10-CM

## 2014-01-15 ENCOUNTER — Encounter (INDEPENDENT_AMBULATORY_CARE_PROVIDER_SITE_OTHER): Payer: BC Managed Care – PPO | Admitting: Physical Therapy

## 2014-01-15 DIAGNOSIS — M25569 Pain in unspecified knee: Secondary | ICD-10-CM

## 2014-01-17 ENCOUNTER — Telehealth: Payer: Self-pay | Admitting: *Deleted

## 2014-01-17 ENCOUNTER — Encounter: Payer: Self-pay | Admitting: Sports Medicine

## 2014-01-17 ENCOUNTER — Ambulatory Visit (INDEPENDENT_AMBULATORY_CARE_PROVIDER_SITE_OTHER): Payer: BC Managed Care – PPO | Admitting: Sports Medicine

## 2014-01-17 VITALS — BP 108/71 | HR 75 | Ht 67.0 in | Wt 162.0 lb

## 2014-01-17 DIAGNOSIS — M25569 Pain in unspecified knee: Secondary | ICD-10-CM

## 2014-01-17 DIAGNOSIS — M25561 Pain in right knee: Secondary | ICD-10-CM

## 2014-01-17 NOTE — Progress Notes (Signed)
  Subjective:    CC: Followup  HPI: Amber Barajas returns to see me for her right knee pain. She continues to have pain that she localizes along the joint lines, as well as under the kneecap. She does get gelling when sitting for long periods of time. We injected her knee at last visit, and she had no response, not even temporary. She is currently doing physical therapy, her predominant discomfort is along the lateral joint line of the lateral upper thigh in the distribution of the IT band.  Past medical history, Surgical history, Family history not pertinant except as noted below, Social history, Allergies, and medications have been entered into the medical record, reviewed, and no changes needed.   Review of Systems: No fevers, chills, night sweats, weight loss, chest pain, or shortness of breath.   Objective:    General: Well Developed, well nourished, and in no acute distress.  Neuro: Alert and oriented x3, extra-ocular muscles intact, sensation grossly intact.  HEENT: Normocephalic, atraumatic, pupils equal round reactive to light, neck supple, no masses, no lymphadenopathy, thyroid nonpalpable.  Skin: Warm and dry, no rashes. Cardiac: Regular rate and rhythm, no murmurs rubs or gallops, no lower extremity edema.  Respiratory: Clear to auscultation bilaterally. Not using accessory muscles, speaking in full sentences. Right Knee: Normal to inspection with no erythema or effusion or obvious bony abnormalities. Tender to palpation along the medial femoral condyle anteriorly. ROM full in flexion and extension and lower leg rotation. Ligaments with solid consistent endpoints including ACL, PCL, LCL, MCL. Negative Mcmurray's, Apley's, and Thessalonian tests. Non painful patellar compression. Patellar glide without crepitus. Patellar and quadriceps tendons unremarkable. Hamstring and quadriceps strength is normal.  Positive Ober's test with reproduction of pain.  Impression and  Recommendations:

## 2014-01-17 NOTE — Assessment & Plan Note (Signed)
Persistent pain despite injection, no response, not even temporary. She did have a positive Ober's test suggestive of IT band tightness. I suggested some stretches for this. I would like to obtain an MRI, she is fairly tender to palpation over the anterior aspect of the medial femoral condyle. Return to see me for MRI results.

## 2014-01-17 NOTE — Telephone Encounter (Signed)
Precert obtained through [facility] online for MRI right knee w/o contrast.  Auth # [id number] good from 01/17/14 to 02/15/14.  [facility] notified. Clemetine Marker, LPN

## 2014-01-23 ENCOUNTER — Ambulatory Visit (INDEPENDENT_AMBULATORY_CARE_PROVIDER_SITE_OTHER): Payer: BC Managed Care – PPO

## 2014-01-23 ENCOUNTER — Encounter (INDEPENDENT_AMBULATORY_CARE_PROVIDER_SITE_OTHER): Payer: BC Managed Care – PPO | Admitting: Physical Therapy

## 2014-01-23 DIAGNOSIS — M25569 Pain in unspecified knee: Secondary | ICD-10-CM

## 2014-01-23 DIAGNOSIS — M25561 Pain in right knee: Secondary | ICD-10-CM

## 2014-02-04 ENCOUNTER — Encounter: Payer: Self-pay | Admitting: Sports Medicine

## 2014-02-04 ENCOUNTER — Ambulatory Visit (INDEPENDENT_AMBULATORY_CARE_PROVIDER_SITE_OTHER): Payer: BC Managed Care – PPO | Admitting: Sports Medicine

## 2014-02-04 VITALS — BP 132/79 | HR 80 | Ht 67.0 in | Wt 163.0 lb

## 2014-02-04 DIAGNOSIS — M1711 Unilateral primary osteoarthritis, right knee: Secondary | ICD-10-CM

## 2014-02-04 DIAGNOSIS — M1710 Unilateral primary osteoarthritis, unspecified knee: Secondary | ICD-10-CM

## 2014-02-04 DIAGNOSIS — IMO0002 Reserved for concepts with insufficient information to code with codable children: Secondary | ICD-10-CM

## 2014-02-04 MED ORDER — SODIUM HYALURONATE (VISCOSUP) 20 MG/2ML IX SOSY: PREFILLED_SYRINGE | INTRA_ARTICULAR | Status: DC

## 2014-02-04 NOTE — Progress Notes (Signed)
  Subjective:    CC: Followup  HPI:  Amber Barajas returns to follow up her difficult to treat right knee pain. Symptoms have been predominantly patellofemoral. I placed her through formal physical therapy, NSAIDs, we injected her knee, nothing has helped. More recently we obtained an MRI, the results of which be dictated below. Pain continues to be moderate, persistent, under the kneecap.  Past medical history, Surgical history, Family history not pertinant except as noted below, Social history, Allergies, and medications have been entered into the medical record, reviewed, and no changes needed.   Review of Systems: No fevers, chills, night sweats, weight loss, chest pain, or shortness of breath.   Objective:    General: Well Developed, well nourished, and in no acute distress.  Neuro: Alert and oriented x3, extra-ocular muscles intact, sensation grossly intact.  HEENT: Normocephalic, atraumatic, pupils equal round reactive to light, neck supple, no masses, no lymphadenopathy, thyroid nonpalpable.  Skin: Warm and dry, no rashes. Cardiac: Regular rate and rhythm, no murmurs rubs or gallops, no lower extremity edema.  Respiratory: Clear to auscultation bilaterally. Not using accessory muscles, speaking in full sentences.  MRI reviewed and showed irregular cartilage under the patella suggestive of patellofemoral chondromalacia, there is also some thickening of the medial patellofemoral ligament.  Impression and Recommendations:

## 2014-02-04 NOTE — Assessment & Plan Note (Signed)
Unfortunately unfortunately has failed physical therapy, steroid injection. MRI showed osteoarthritis under the kneecap. Hip abductors were strong. Continue aggressive home rehabilitation. We are going to start viscosupplementation with Euflexxa. Prescription given. Return for shots.

## 2014-02-14 ENCOUNTER — Ambulatory Visit (INDEPENDENT_AMBULATORY_CARE_PROVIDER_SITE_OTHER): Payer: BC Managed Care – PPO | Admitting: Sports Medicine

## 2014-02-14 ENCOUNTER — Encounter: Payer: Self-pay | Admitting: Sports Medicine

## 2014-02-14 VITALS — BP 116/71 | HR 75 | Ht 67.0 in | Wt 161.0 lb

## 2014-02-14 DIAGNOSIS — IMO0002 Reserved for concepts with insufficient information to code with codable children: Secondary | ICD-10-CM

## 2014-02-14 DIAGNOSIS — M1710 Unilateral primary osteoarthritis, unspecified knee: Secondary | ICD-10-CM

## 2014-02-14 DIAGNOSIS — M1711 Unilateral primary osteoarthritis, right knee: Secondary | ICD-10-CM

## 2014-02-14 NOTE — Progress Notes (Signed)
   Procedure: Real-time Ultrasound Guided Injection of right knee Device: GE Logiq E  Verbal informed consent obtained.  Time-out conducted.  Noted no overlying erythema, induration, or other signs of local infection.  Skin prepped in a sterile fashion.  Local anesthesia: Topical Ethyl chloride.  With sterile technique and under real time ultrasound guidance:  20 mg/2 mL of Euflexxa (sodium hyaluronate) in a prefilled syringe was injected easily into the knee through a 22-gauge needle. Completed without difficulty  Pain immediately resolved suggesting accurate placement of the medication.  Advised to call if fevers/chills, erythema, induration, drainage, or persistent bleeding.  Images permanently stored and available for review in the ultrasound unit.  Impression: Technically successful ultrasound guided injection.

## 2014-02-14 NOTE — Assessment & Plan Note (Addendum)
MRI does show some osteoarthritis, continue aggressive home rehabilitation. We are also starting Euflexxa. Return in one week for injection #2 of 3. Patient has supplied the syringes, and she will be keeping them.

## 2014-02-21 ENCOUNTER — Encounter: Payer: Self-pay | Admitting: Sports Medicine

## 2014-02-21 ENCOUNTER — Ambulatory Visit (INDEPENDENT_AMBULATORY_CARE_PROVIDER_SITE_OTHER): Payer: BC Managed Care – PPO | Admitting: Sports Medicine

## 2014-02-21 VITALS — BP 116/74 | HR 67 | Ht 67.0 in | Wt 160.0 lb

## 2014-02-21 DIAGNOSIS — M1710 Unilateral primary osteoarthritis, unspecified knee: Secondary | ICD-10-CM

## 2014-02-21 DIAGNOSIS — IMO0002 Reserved for concepts with insufficient information to code with codable children: Secondary | ICD-10-CM

## 2014-02-21 DIAGNOSIS — M1711 Unilateral primary osteoarthritis, right knee: Secondary | ICD-10-CM

## 2014-02-21 NOTE — Progress Notes (Signed)
   Procedure: Real-time Ultrasound Guided Injection of right knee Device: GE Logiq E  Verbal informed consent obtained.  Time-out conducted.  Noted no overlying erythema, induration, or other signs of local infection.  Skin prepped in a sterile fashion.  Local anesthesia: Topical Ethyl chloride.  With sterile technique and under real time ultrasound guidance:  20 mg/2 mL of Euflexxa (sodium hyaluronate) in a prefilled syringe was injected easily into the knee through a 25-gauge needle. Completed without difficulty  Pain immediately resolved suggesting accurate placement of the medication.  Advised to call if fevers/chills, erythema, induration, drainage, or persistent bleeding.  Images permanently stored and available for review in the ultrasound unit.  Impression: Technically successful ultrasound guided injection. 

## 2014-02-21 NOTE — Assessment & Plan Note (Signed)
Euflexxa injection #2 of 3 given today. Return in 1 week for number 3. Patient has supplied the syringes, and she will be keeping them.

## 2014-02-26 IMAGING — MG MM DIGITAL DIAGNOSTIC UNILAT*R*
2 series · 2 of 2 positions shown · non-contrast
Comparison: Previous exams.

CLINICAL DATA: Status post ultrasound-guided core biopsy of
distortion in the right breast.

DIGITAL DIAGNOSTIC RIGHT MAMMOGRAM

[R CC]
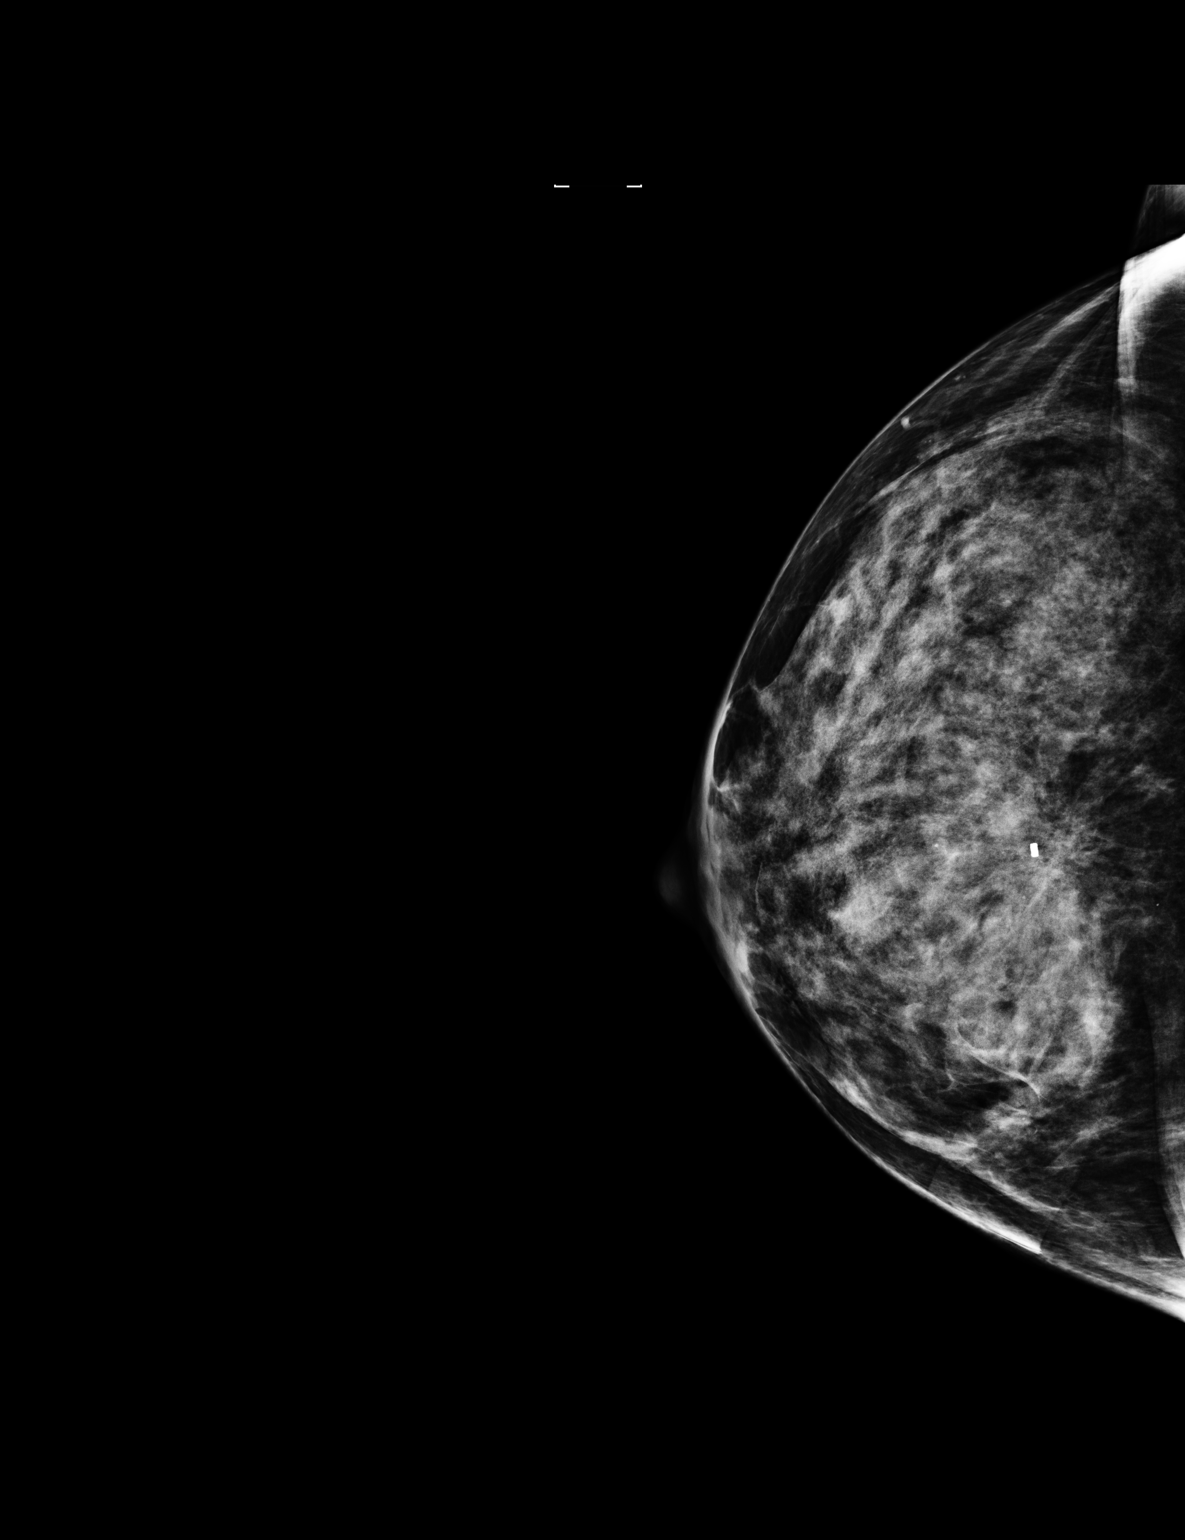

[R ML]
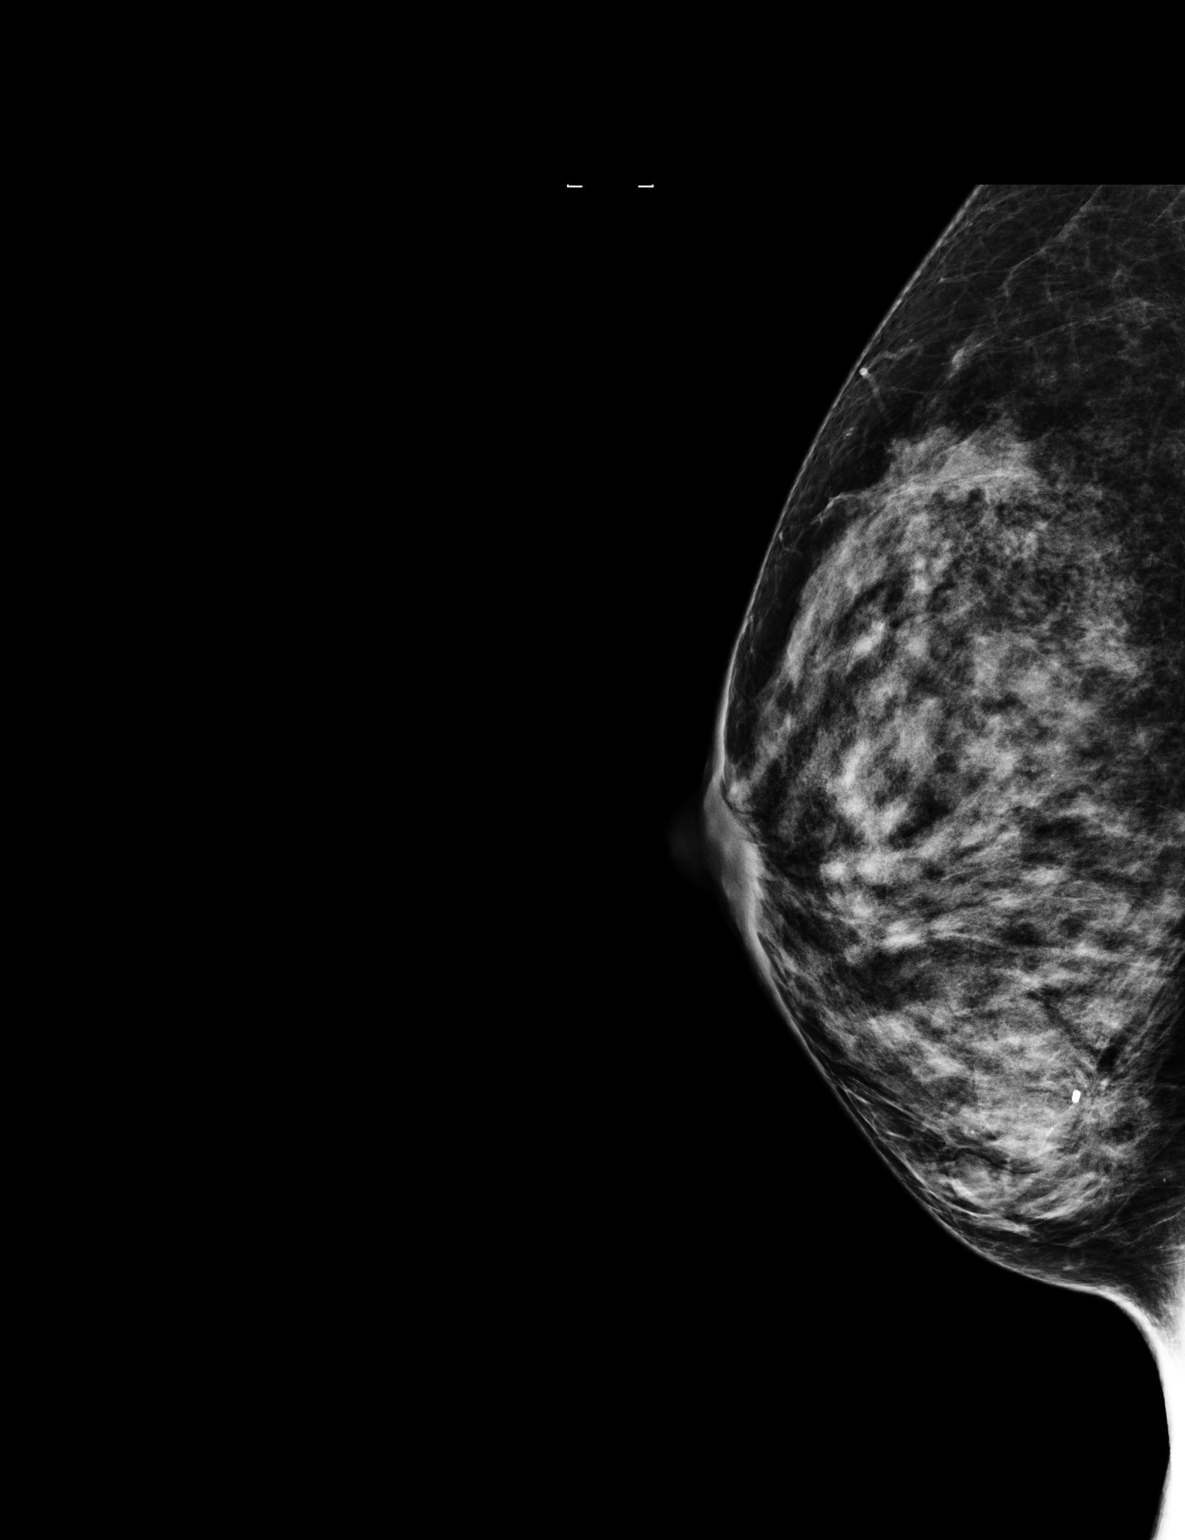

[2 of 2 positions shown; findings below may reference images not displayed]

FINDINGS: Mammographic images were obtained following ultrasound
guided biopsy of abnormal shadowing in the 6 o'clock region of the
right breast.  Mammographic images show there is a cylindrical
shaped clip in the distortion in the 6 o'clock region of the
breast..
IMPRESSION: Status post ultrasound-guided core biopsy of the right breast with
pathology pending.

Final Assessment:  Post Procedure Mammograms for Marker Placement

## 2014-02-28 ENCOUNTER — Ambulatory Visit (INDEPENDENT_AMBULATORY_CARE_PROVIDER_SITE_OTHER): Payer: BC Managed Care – PPO | Admitting: Sports Medicine

## 2014-02-28 ENCOUNTER — Encounter: Payer: Self-pay | Admitting: Sports Medicine

## 2014-02-28 VITALS — BP 124/83 | HR 76 | Ht 67.0 in | Wt 161.0 lb

## 2014-02-28 DIAGNOSIS — M1710 Unilateral primary osteoarthritis, unspecified knee: Secondary | ICD-10-CM

## 2014-02-28 DIAGNOSIS — M1711 Unilateral primary osteoarthritis, right knee: Secondary | ICD-10-CM

## 2014-02-28 DIAGNOSIS — IMO0002 Reserved for concepts with insufficient information to code with codable children: Secondary | ICD-10-CM

## 2014-02-28 NOTE — Assessment & Plan Note (Signed)
Euflexxa injection #3 today into the right knee. She is becoming pain-free. Start working extensively on strengthening exercises.

## 2014-02-28 NOTE — Progress Notes (Signed)
    Procedure: Real-time Ultrasound Guided Injection of right knee Device: GE Logiq E  Verbal informed consent obtained.  Time-out conducted.  Noted no overlying erythema, induration, or other signs of local infection.  Skin prepped in a sterile fashion.  Local anesthesia: Topical Ethyl chloride.  With sterile technique and under real time ultrasound guidance:  20 mg/2 mL of Euflexxa (sodium hyaluronate) in a prefilled syringe was injected easily into the knee through a 25-gauge needle. Completed without difficulty  Pain immediately resolved suggesting accurate placement of the medication.  Advised to call if fevers/chills, erythema, induration, drainage, or persistent bleeding.  Images permanently stored and available for review in the ultrasound unit.  Impression: Technically successful ultrasound guided injection.

## 2014-04-01 ENCOUNTER — Encounter: Payer: Self-pay | Admitting: Sports Medicine

## 2014-04-01 ENCOUNTER — Ambulatory Visit (INDEPENDENT_AMBULATORY_CARE_PROVIDER_SITE_OTHER): Payer: BC Managed Care – PPO | Admitting: Sports Medicine

## 2014-04-01 VITALS — BP 123/79 | HR 75 | Wt 162.0 lb

## 2014-04-01 DIAGNOSIS — M1711 Unilateral primary osteoarthritis, right knee: Secondary | ICD-10-CM

## 2014-04-01 DIAGNOSIS — M1710 Unilateral primary osteoarthritis, unspecified knee: Secondary | ICD-10-CM

## 2014-04-01 NOTE — Progress Notes (Signed)
  Subjective:    CC: Followup  HPI: Right knee osteoarthritis: Pain-free now after series of Euflexxa injections.  Past medical history, Surgical history, Family history not pertinant except as noted below, Social history, Allergies, and medications have been entered into the medical record, reviewed, and no changes needed.   Review of Systems: No fevers, chills, night sweats, weight loss, chest pain, or shortness of breath.   Objective:    General: Well Developed, well nourished, and in no acute distress.  Neuro: Alert and oriented x3, extra-ocular muscles intact, sensation grossly intact.  HEENT: Normocephalic, atraumatic, pupils equal round reactive to light, neck supple, no masses, no lymphadenopathy, thyroid nonpalpable.  Skin: Warm and dry, no rashes. Cardiac: Regular rate and rhythm, no murmurs rubs or gallops, no lower extremity edema.  Respiratory: Clear to auscultation bilaterally. Not using accessory muscles, speaking in full sentences. Right knee: Normal to inspection with no erythema or effusion or obvious bony abnormalities. Palpation normal with no warmth, joint line tenderness, patellar tenderness, or condyle tenderness. ROM full in flexion and extension and lower leg rotation. Ligaments with solid consistent endpoints including ACL, PCL, LCL, MCL. Negative Mcmurray's, Apley's, and Thessalonian tests. Non painful patellar compression. Patellar glide without crepitus. Patellar and quadriceps tendons unremarkable. Hamstring and quadriceps strength is normal.   Impression and Recommendations:

## 2014-04-01 NOTE — Assessment & Plan Note (Signed)
Pain-free after a series of Euflexxa. Return as needed.

## 2014-04-17 ENCOUNTER — Other Ambulatory Visit: Payer: Self-pay | Admitting: Obstetrics and Gynecology

## 2014-04-17 DIAGNOSIS — Z1231 Encounter for screening mammogram for malignant neoplasm of breast: Secondary | ICD-10-CM

## 2014-05-28 ENCOUNTER — Ambulatory Visit: Payer: BC Managed Care – PPO

## 2014-09-05 IMAGING — CR DG KNEE COMPLETE 4+V*R*
4 series · 4 of 4 positions shown · non-contrast
Comparison: None.

CLINICAL DATA: Right lateral knee pain for many years, no acute
injury

EXAM:
RIGHT KNEE - COMPLETE 4+ VIEW

[view not recorded (1 of 4)]
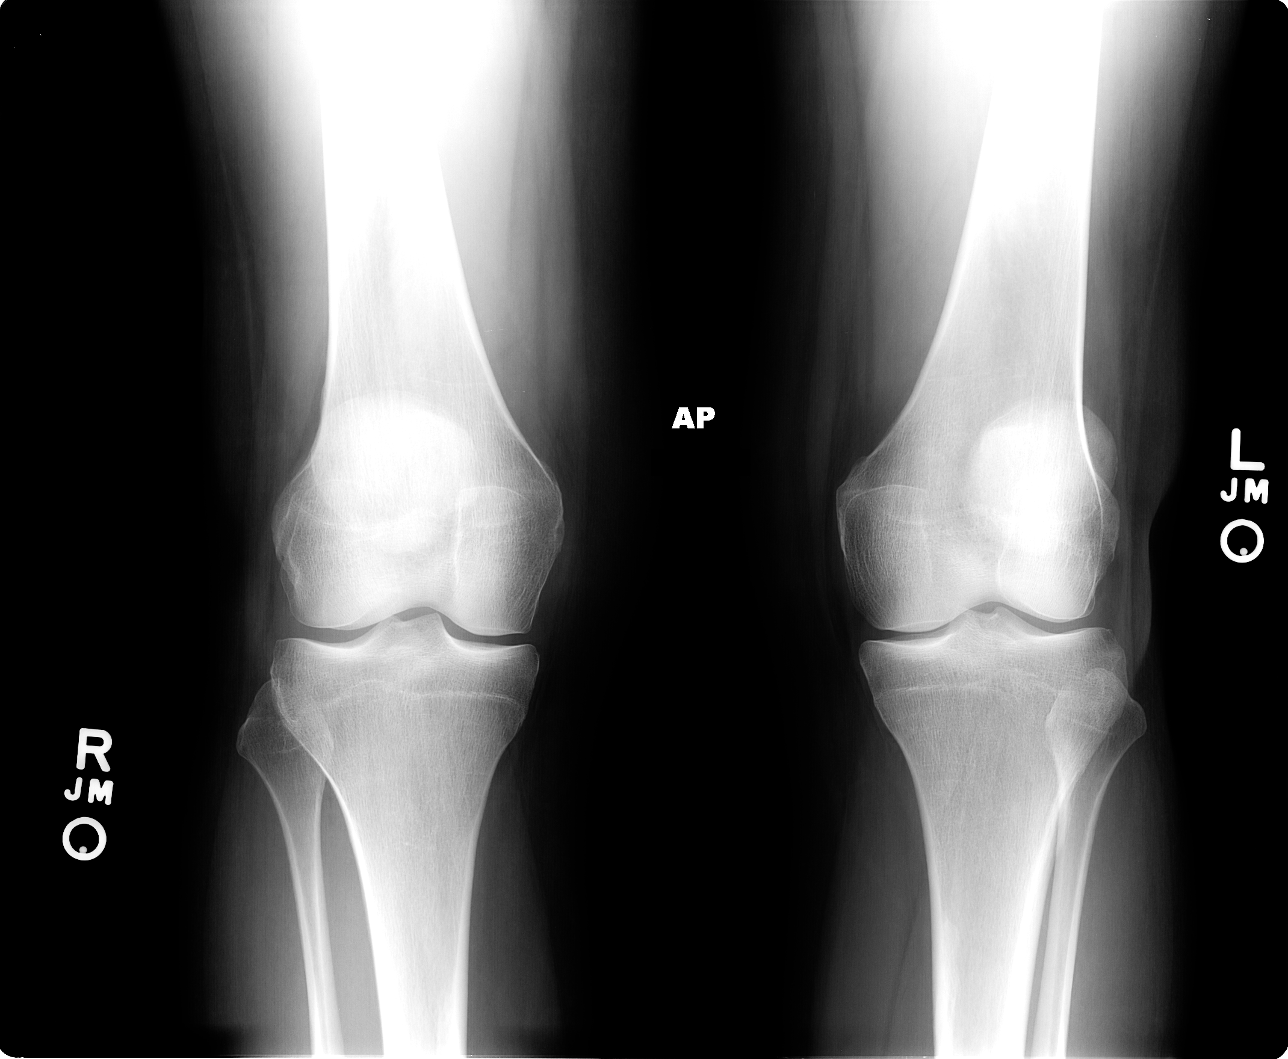

[view not recorded (2 of 4)]
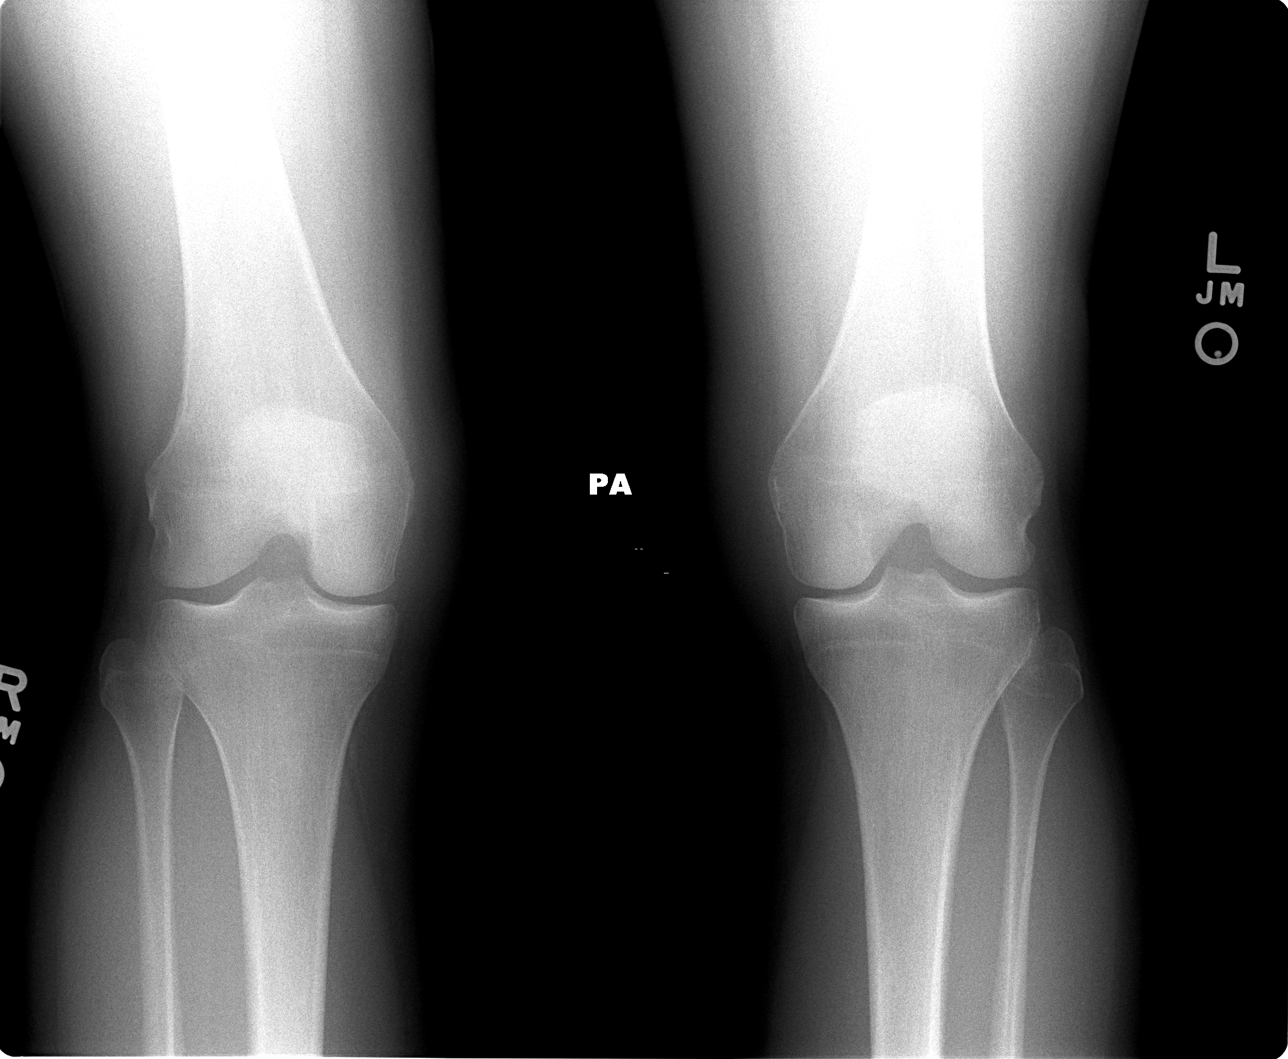

[view not recorded (3 of 4)]
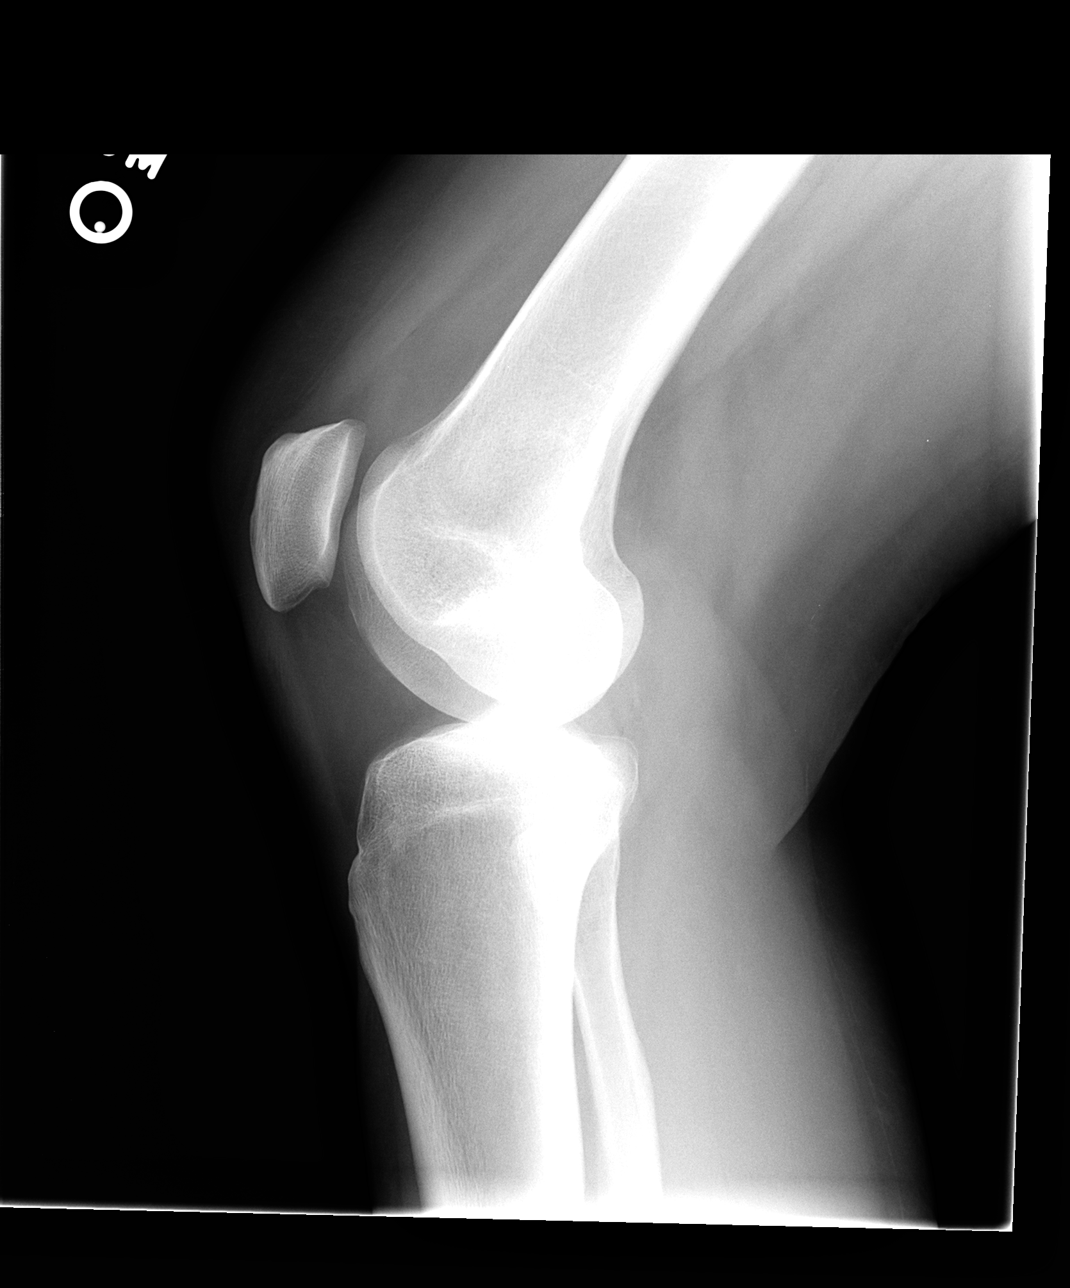

[view not recorded (4 of 4)]
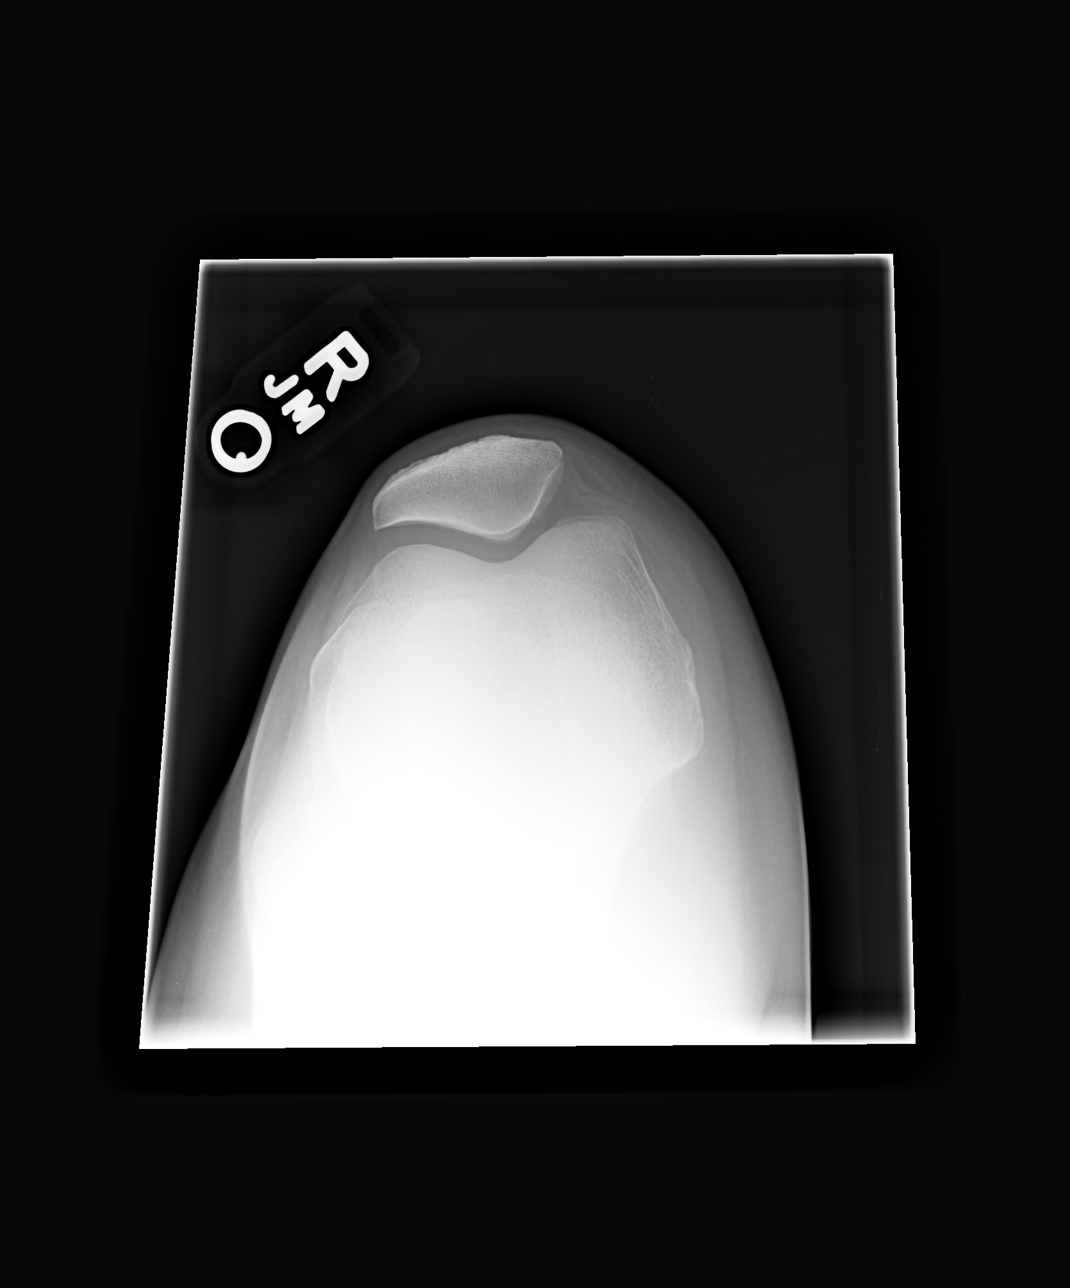

[4 of 4 positions shown; findings below may reference images not displayed]

FINDINGS: Standing views of both knees show relatively normal joint spaces. No
right knee joint effusion is seen. The right patella is normally
positioned within the patellofemoral groove.
IMPRESSION: Negative.

## 2014-11-20 DIAGNOSIS — IMO0001 Reserved for inherently not codable concepts without codable children: Secondary | ICD-10-CM

## 2014-11-20 DIAGNOSIS — F1020 Alcohol dependence, uncomplicated: Secondary | ICD-10-CM | POA: Insufficient documentation

## 2014-11-20 HISTORY — DX: Reserved for inherently not codable concepts without codable children: IMO0001

## 2015-03-19 DIAGNOSIS — Z1589 Genetic susceptibility to other disease: Secondary | ICD-10-CM

## 2015-03-19 HISTORY — DX: Genetic susceptibility to other disease: Z15.89

## 2015-06-20 LAB — LIPID PANEL
Cholesterol: 251 mg/dL — AB (ref 0–200)
HDL: 57 mg/dL (ref 35–70)
LDL Cholesterol: 138 mg/dL
Triglycerides: 280 mg/dL — AB (ref 40–160)

## 2015-06-20 LAB — BASIC METABOLIC PANEL
BUN: 12 mg/dL (ref 4–21)
Creatinine: 0.7 mg/dL (ref 0.5–1.1)
GLUCOSE: 83 mg/dL
POTASSIUM: 4.3 mmol/L (ref 3.4–5.3)
Sodium: 139 mmol/L (ref 137–147)

## 2015-06-20 LAB — HEMOGLOBIN A1C: HEMOGLOBIN A1C: 5.3 % (ref 4.0–6.0)

## 2015-06-20 LAB — HEPATIC FUNCTION PANEL
ALT: 16 U/L (ref 7–35)
AST: 19 U/L (ref 13–35)
Alkaline Phosphatase: 102 U/L (ref 25–125)
BILIRUBIN, TOTAL: 0.4 mg/dL

## 2015-07-08 DIAGNOSIS — R5382 Chronic fatigue, unspecified: Secondary | ICD-10-CM

## 2015-07-08 DIAGNOSIS — G9332 Myalgic encephalomyelitis/chronic fatigue syndrome: Secondary | ICD-10-CM

## 2015-07-08 DIAGNOSIS — W57XXXA Bitten or stung by nonvenomous insect and other nonvenomous arthropods, initial encounter: Secondary | ICD-10-CM

## 2015-07-08 HISTORY — DX: Myalgic encephalomyelitis/chronic fatigue syndrome: G93.32

## 2015-07-08 HISTORY — DX: Chronic fatigue, unspecified: R53.82

## 2015-07-08 HISTORY — DX: Bitten or stung by nonvenomous insect and other nonvenomous arthropods, initial encounter: W57.XXXA

## 2015-08-06 ENCOUNTER — Encounter: Payer: Self-pay | Admitting: Internal Medicine

## 2015-08-07 ENCOUNTER — Encounter: Payer: Self-pay | Admitting: Internal Medicine

## 2015-08-07 ENCOUNTER — Ambulatory Visit (INDEPENDENT_AMBULATORY_CARE_PROVIDER_SITE_OTHER): Payer: BLUE CROSS/BLUE SHIELD | Admitting: Internal Medicine

## 2015-08-07 VITALS — BP 118/72 | HR 89 | Temp 98.3°F | Resp 12 | Ht 67.75 in | Wt 178.6 lb

## 2015-08-07 DIAGNOSIS — R5382 Chronic fatigue, unspecified: Secondary | ICD-10-CM | POA: Diagnosis not present

## 2015-08-07 DIAGNOSIS — E039 Hypothyroidism, unspecified: Secondary | ICD-10-CM

## 2015-08-07 LAB — TSH: TSH: 0.09 u[IU]/mL — ABNORMAL LOW (ref 0.35–4.50)

## 2015-08-07 LAB — T4, FREE: FREE T4: 0.69 ng/dL (ref 0.60–1.60)

## 2015-08-07 LAB — T3, FREE: T3 FREE: 4.7 pg/mL — AB (ref 2.3–4.2)

## 2015-08-07 MED ORDER — THYROID 81.25 MG PO TABS: ORAL_TABLET | ORAL | Status: DC

## 2015-08-07 NOTE — Progress Notes (Signed)
Patient ID: Amber Barajas, female   DOB: 22-Mar-1971, 44 y.o.   MRN: [id number]   HPI  Amber Barajas is a 44 y.o.-year-old female, self-referred for management of hypothyroidism.  She has been diagnosed with hypothyroidism in 2012. She started to have sxs in 2008. She had weight gain, heavy periods (had endom. Ablation - now normal periods). She then saw an endocrinologist >> Rx'ed Synthroid. She did not feel good.  Patient has been seen at [facility], and she has been on Naturethroid 1.25 gr twice a day (equivalent levothyroxine dose of 250 g daily) at last visit on 07/08/2015. She was also on Lugol's iodine solution 2 drops daily (stopped). Of note, patient is on lithium orotate daily at bedtime - for improved sleep and relaxation (stopped).  She [facility], however, TSH levels have not been checked, her thyroid hormone dose adjustments were made based on free T3 and free T4 (!)  Reviewed the records from her [place]: Labs on 06/20/2015:   free T3 3.1 (2-4.4), free T4 0.77 (0.82-1.77), TPO antibodies 14 (0-34), ATA <1.0 (0-0.9) Previous labs on 02/14/2015:  Free T3 3.1 (2-4.4), free T4 0.76 (0.82-1.77)  She takes the thyroid hormones: - fasting and at 4 pm - with water - separated by >30 min from b'fast  - no calcium, iron, PPIs, multivitamins    Pt describes: - + weight gain (after starting "adrenal support") - 20 lbs in last year - + fatigue (she was diagnosed with chronic fatigue syndrome) - + hot intolerance - + palpitations - + anxiety/depression depression - + poor sleep - wakes up at 2 am - + diarrhea/ no constipation - no dry skin - no hair loss  Pt denies feeling nodules in neck, hoarseness, dysphagia/odynophagia, SOB with lying down.  She has no FH of thyroid disorders. No FH of thyroid cancer.  No h/o radiation tx to head or neck.  I reviewed her records and she  also has a h/o of: Anxiety (GAD), alcoholism - now with better control of cravings. 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 She is on methylated vitamins. She is on multiple other supplements.  She stopped: testosterone, pregnenolone, DHEA, and we have a new hormonal evaluation with her naturopath.  ROS: Constitutional: + see HPI Eyes: no blurry vision, no xerophthalmia ENT: no sore throat, no nodules palpated in throat, + dysphagia/no odynophagia, no hoarseness, + tinnitus Cardiovascular: no CP/SOB/+ palpitations/no leg swelling Respiratory: no cough/SOB Gastrointestinal: + N/+V/+D/no C Musculoskeletal: no muscle/joint aches Skin: no rashes, + easy bruising Neurological: no tremors/numbness/tingling/dizziness, + lightheadedness/vertigo Psychiatric: + depression/+ anxiety + low libido  Past Medical History  Diagnosis Date  . Thyroid disease   . Perimenopausal   . Depression 12/22/12  . 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 Anxiety   . Alcoholism /alcohol abuse ([place]) 11/20/14  . 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 Chronic fatigue syndrome 07/08/15  . Tick bite 07/08/15   Past Surgical History  Procedure Laterality Date  . Novasure ablation    . Essure tubal ligation    . Cholecystectomy  2011    lap choli  . Wisdom tooth extraction  2003  . Tonsillectomy    . Breast lumpectomy Right 08/01/2013    Procedure: LUMPECTOMY;  Surgeon: Merrie Roof, MD;  Location: [place];  Service: General;  Laterality: Right;   Social History   Social History  .  Marital Status: Married    Spouse Name: N/A  . Number of Children: 0   Occupational History  . student   Social History Main Topics  . Smoking status: Never Smoker   . Smokeless tobacco: Never Used  . Alcohol Use: No  . Drug Use: No   Current Outpatient Prescriptions on File Prior to Visit  Medication Sig Dispense Refill  . Ascorbic Acid (VITAMIN C) 1000 MG  tablet Take 2,000 mg by mouth 2 (two) times daily.    . ASHWAGANDHA PO Take by mouth. At bedtime    . cholecalciferol (VITAMIN D) 1000 UNITS tablet Take 1,000 Units by mouth daily. Takes 5000u    . clonazePAM (KLONOPIN) 0.5 MG tablet Take 0.5 mg by mouth 2 (two) times daily as needed for anxiety.    . Royal Jelly 500 MG CAPS Take 1,000 mg by mouth daily.    Marland Kitchen ALPRAZolam (XANAX) 1 MG tablet Take 1 mg by mouth 2 (two) times daily as needed for anxiety.    . cloNIDine (CATAPRES) 0.1 MG tablet Take 0.1 mg by mouth 3 (three) times daily.    . Fish Oil-Cholecalciferol (FISH OIL + D3) 1200-1000 MG-UNIT CAPS Take 2 capsules by mouth daily.    . Grape Seed Extract 100 MG CAPS Take 200 mg by mouth daily.    . Iodine Strong, Lugol's, SOLN by Other route. 2 drops daily    . L-Lysine 500 MG CAPS Take 1 capsule by mouth daily.    . L-METHIONINE PO Take by mouth. As needed    . LITHIUM PO Take by mouth. At bedtime    . MAGNESIUM GLYCINATE PLUS PO Take 133 mg by mouth 2 (two) times daily.    . meloxicam (MOBIC) 15 MG tablet One tab PO qAM with breakfast for 2 weeks, then daily prn pain. 30 tablet 3  . Menaquinone-7 (VITAMIN K2) 100 MCG CAPS Take 1 capsule by mouth daily.    Marland Kitchen MILK THISTLE PO Take 1 capsule by mouth daily.    Marland Kitchen NATURE-THROID 81.25 MG TABS 1.5 g 2 (two) times daily.     . NONFORMULARY OR COMPOUNDED ITEM Bioidentical Testosterone Cream 2%, take 9.92 ml (2 clicks) behind knee daily.    Marland Kitchen OVER THE COUNTER MEDICATION Methylation Complete Daily    . OVER THE COUNTER MEDICATION 2 (two) times daily. [place]    . OVER THE COUNTER MEDICATION Hyland's Sleep Homeopathic as needed for sleep    . OVER THE COUNTER MEDICATION Relora, 2 capsules at bedtime    . Probiotic Product (PROBIOTIC ADVANCED) CAPS Take by mouth. 25 billion daily    . Progesterone Micronized (PROGESTERONE PO) Take 100 mg by mouth at bedtime.    . pyridOXINE (VITAMIN B-6) 100 MG tablet Take 100 mg by mouth daily.    . Rhodiola  rosea (RHODIOLA PO) Take 1 capsule by mouth daily.    . Sodium Hyaluronate (EUFLEXXA) 20 MG/2ML SOSY Injected to the right knee weekly for 3 weeks. Dx: Osteoarthritis 3 Syringe 0  . thiamine (VITAMIN B-1) 100 MG tablet Take 100 mg by mouth daily.    . vitamin B-12 (CYANOCOBALAMIN) 1000 MCG tablet Take 1,000 mcg by mouth daily.    . vitamin E 400 UNIT capsule Take 400 Units by mouth daily.    Marland Kitchen zinc gluconate 50 MG tablet Take 50 mg by mouth daily. 5 days weekly     No current facility-administered medications on file prior to visit.   Allergies  Allergen Reactions  .  Penicillins Hives and Itching   Family History  Problem Relation Age of Onset  . Cancer Father     lung  . Cancer Paternal Uncle     brain   PE: BP 118/72 mmHg  Pulse 89  Temp(Src) 98.3 F (36.8 C) (Oral)  Resp 12  Ht 5' 7.75" (1.721 m)  Wt 178 lb 9.6 oz (81.012 kg)  BMI 27.35 kg/m2  SpO2 95% Wt Readings from Last 3 Encounters:  08/07/15 178 lb 9.6 oz (81.012 kg)  04/01/14 162 lb (73.483 kg)  02/28/14 161 lb (73.029 kg)   Constitutional: overweight, in NAD Eyes: PERRLA, EOMI, no exophthalmos ENT: moist mucous membranes, no thyromegaly, no cervical lymphadenopathy Cardiovascular: RRR, No MRG Respiratory: CTA B Gastrointestinal: abdomen soft, NT, ND, BS+ Musculoskeletal: no deformities, strength intact in all 4 Skin: moist, warm, no rashes Neurological: + tremor with outstretched hands, DTR normal in all 4  ASSESSMENT: 1. Hypothyroidism  2. Fatigue  PLAN:  1. Patient with long-standing hypothyroidism, on high dose desiccated thyroid extract. She appears restless, she has tremor with outstretched hands and she complains of palpitations. She also has problems sleeping at night, and I believe these can stem from taking the second dose of nature thyroid in the afternoon.  - We discussed at length with patient and her husband about the fact that hypothyroidism has to be followed by TSH which is a better test  to assess the thyroid replacement compared to free T4 and free T3, which are very dependent on the timing of her NT dose compared to the time of the lab draw.  - I suspect that she is overly suppressed with nature thyroid, and she is having side effects from this. We discussed about possible chronic side effects of over replacement with thyroid hormones, which include, but are not limited to: Arrhythmia, hypercoagulability, osteoporosis. On the shorter timeframe, she can have tremors, palpitations, heat intolerance, insomnia, increased GI transit. She is experiencing all of these. She is not tachycardic and she did not lose weight, but increased hunger with weight gain has been observed in some patients with thyrotoxicosis.  - We discussed about staying on nature thyroid after the results return, and to decrease the dose if the TSH is suppressed. If the TSH is normal, I would suggest to take a higher dose in the morning and maybe a smaller dose or no dose at all in the afternoon to allow her to sleep at night. She agrees with this plan.  - We discussed about correct intake of levothyroxine, fasting, with water, separated by at least 30 minutes from breakfast, and separated by more than 4 hours from calcium, iron, multivitamins, acid reflux medications (PPIs). She is taking it correctly. - will check thyroid tests today: TSH, free T4, free T4 - If these are abnormal, she will need to return in 6 weeks for repeat labs - If these are normal, I will see her back in 3 months  2. Fatigue - I reviewed her list of supplements and hormones along with patient and her husband. I indicated which ones are important and which ones she should stop or not restart. - I also reviewed the list of Lyme and EBV antibody workup, which seemed to show elevated IgG is but not IgM's. However, I do not know how to interpret them further, so I directed her to infectious disease to see if she has a past infection or chronic one. -  She was also told in the past that she  had adrenal fatigue and I expanded this is not a recognized medical entity. She was started on "adrenal support" after which she started to gain a lot of weight. She is now off this medication. I do not see any possible sign of adrenal insufficiency in her, other than fatigue, which has been an ongoing problem. However which is start by improving her thyroid status and stop unnecessary supplements, and then we can reevaluate her fatigue.  - time spent with the patient: 1 hour, of which >50% was spent in obtaining information about her symptoms, reviewing her previous labs, evaluations, and treatments, counseling her about her conditions (please see the discussed topics above), and developing a plan to further investigate and treat them; she and her husband had a number of questions which I addressed.  Office Visit on 08/07/2015  Component Date Value Ref Range Status  . T3, Free 08/07/2015 4.7* 2.3 - 4.2 pg/mL Final  . Free T4 08/07/2015 0.69  0.60 - 1.60 ng/dL Final  . TSH 08/07/2015 0.09* 0.35 - 4.50 uIU/mL Final   As suspected, her TSH is very suppressed, so we'll need to decrease her dose of nature thyroid.  I will advise her to take 1.25 gr in a.m. and 0.5 gr in p.m. (TDD equivalent of 175 mcg LT4 daily) Will recheck TFTs in 6 weeks.

## 2015-08-07 NOTE — Patient Instructions (Signed)
Please stop at the lab.  Continue the same dose of NT for now: 1.25 gr 2x a day.  Please come back for a follow-up appointment in 3 months.

## 2015-08-19 ENCOUNTER — Other Ambulatory Visit: Payer: Self-pay | Admitting: Obstetrics and Gynecology

## 2015-08-19 DIAGNOSIS — R928 Other abnormal and inconclusive findings on diagnostic imaging of breast: Secondary | ICD-10-CM

## 2015-08-26 ENCOUNTER — Ambulatory Visit
Admission: RE | Admit: 2015-08-26 | Discharge: 2015-08-26 | Disposition: A | Discharge status: Home / Self Care | Payer: BLUE CROSS/BLUE SHIELD | Source: Ambulatory Visit | Attending: Obstetrics and Gynecology | Admitting: Obstetrics and Gynecology

## 2015-08-26 ENCOUNTER — Other Ambulatory Visit: Payer: Self-pay | Admitting: Obstetrics and Gynecology

## 2015-08-26 DIAGNOSIS — R928 Other abnormal and inconclusive findings on diagnostic imaging of breast: Secondary | ICD-10-CM

## 2015-08-28 ENCOUNTER — Other Ambulatory Visit: Payer: Self-pay | Admitting: Obstetrics and Gynecology

## 2015-08-28 DIAGNOSIS — R928 Other abnormal and inconclusive findings on diagnostic imaging of breast: Secondary | ICD-10-CM

## 2015-08-29 ENCOUNTER — Ambulatory Visit
Admission: RE | Admit: 2015-08-29 | Discharge: 2015-08-29 | Disposition: A | Discharge status: Home / Self Care | Payer: BLUE CROSS/BLUE SHIELD | Source: Ambulatory Visit | Attending: Obstetrics and Gynecology | Admitting: Obstetrics and Gynecology

## 2015-08-29 DIAGNOSIS — R928 Other abnormal and inconclusive findings on diagnostic imaging of breast: Secondary | ICD-10-CM

## 2015-08-29 LAB — HM MAMMOGRAPHY: HM Mammogram: NORMAL

## 2015-09-18 ENCOUNTER — Other Ambulatory Visit (INDEPENDENT_AMBULATORY_CARE_PROVIDER_SITE_OTHER): Payer: BLUE CROSS/BLUE SHIELD

## 2015-09-18 DIAGNOSIS — E039 Hypothyroidism, unspecified: Secondary | ICD-10-CM

## 2015-09-19 ENCOUNTER — Other Ambulatory Visit: Payer: BLUE CROSS/BLUE SHIELD

## 2015-09-19 ENCOUNTER — Other Ambulatory Visit: Payer: Self-pay | Admitting: General Surgery

## 2015-09-19 DIAGNOSIS — N6022 Fibroadenosis of left breast: Secondary | ICD-10-CM

## 2015-09-19 LAB — T4, FREE: Free T4: 0.59 ng/dL — ABNORMAL LOW (ref 0.60–1.60)

## 2015-09-19 LAB — TSH: TSH: 0.64 u[IU]/mL (ref 0.35–4.50)

## 2015-09-19 LAB — T3, FREE: T3, Free: 3.7 pg/mL (ref 2.3–4.2)

## 2015-09-21 ENCOUNTER — Encounter: Payer: Self-pay | Admitting: Internal Medicine

## 2015-09-22 ENCOUNTER — Other Ambulatory Visit: Payer: Self-pay | Admitting: *Deleted

## 2015-09-22 MED ORDER — THYROID 81.25 MG PO TABS: ORAL_TABLET | ORAL | Status: DC

## 2015-10-16 ENCOUNTER — Encounter (HOSPITAL_BASED_OUTPATIENT_CLINIC_OR_DEPARTMENT_OTHER): Payer: Self-pay | Admitting: *Deleted

## 2015-10-16 ENCOUNTER — Other Ambulatory Visit: Payer: Self-pay | Admitting: General Surgery

## 2015-10-16 DIAGNOSIS — N6022 Fibroadenosis of left breast: Secondary | ICD-10-CM

## 2015-10-20 ENCOUNTER — Inpatient Hospital Stay: Admission: RE | Admit: 2015-10-20 | Payer: BLUE CROSS/BLUE SHIELD | Source: Ambulatory Visit

## 2015-10-21 ENCOUNTER — Ambulatory Visit
Admission: RE | Admit: 2015-10-21 | Discharge: 2015-10-21 | Disposition: A | Discharge status: Home / Self Care | Payer: 59 | Source: Ambulatory Visit | Attending: General Surgery | Admitting: General Surgery

## 2015-10-21 DIAGNOSIS — N6022 Fibroadenosis of left breast: Secondary | ICD-10-CM

## 2015-10-23 ENCOUNTER — Encounter (HOSPITAL_BASED_OUTPATIENT_CLINIC_OR_DEPARTMENT_OTHER): Payer: Self-pay | Admitting: Anesthesiology

## 2015-10-23 ENCOUNTER — Encounter (HOSPITAL_BASED_OUTPATIENT_CLINIC_OR_DEPARTMENT_OTHER)
Admission: RE | Disposition: A | Discharge status: Home / Self Care | Payer: Self-pay | Source: Ambulatory Visit | Attending: General Surgery

## 2015-10-23 ENCOUNTER — Ambulatory Visit
Admission: RE | Admit: 2015-10-23 | Discharge: 2015-10-23 | Disposition: A | Discharge status: Home / Self Care | Payer: 59 | Source: Ambulatory Visit | Attending: General Surgery | Admitting: General Surgery

## 2015-10-23 ENCOUNTER — Ambulatory Visit (HOSPITAL_BASED_OUTPATIENT_CLINIC_OR_DEPARTMENT_OTHER)
Admission: RE | Admit: 2015-10-23 | Discharge: 2015-10-23 | Disposition: A | Discharge status: Home / Self Care | Payer: 59 | Source: Ambulatory Visit | Attending: General Surgery | Admitting: General Surgery

## 2015-10-23 ENCOUNTER — Ambulatory Visit (HOSPITAL_BASED_OUTPATIENT_CLINIC_OR_DEPARTMENT_OTHER): Payer: 59 | Admitting: Anesthesiology

## 2015-10-23 DIAGNOSIS — N6092 Unspecified benign mammary dysplasia of left breast: Secondary | ICD-10-CM | POA: Diagnosis not present

## 2015-10-23 DIAGNOSIS — Z79899 Other long term (current) drug therapy: Secondary | ICD-10-CM | POA: Diagnosis not present

## 2015-10-23 DIAGNOSIS — D242 Benign neoplasm of left breast: Secondary | ICD-10-CM | POA: Insufficient documentation

## 2015-10-23 DIAGNOSIS — R5382 Chronic fatigue, unspecified: Secondary | ICD-10-CM | POA: Insufficient documentation

## 2015-10-23 DIAGNOSIS — F418 Other specified anxiety disorders: Secondary | ICD-10-CM | POA: Insufficient documentation

## 2015-10-23 DIAGNOSIS — M1990 Unspecified osteoarthritis, unspecified site: Secondary | ICD-10-CM | POA: Insufficient documentation

## 2015-10-23 DIAGNOSIS — N6022 Fibroadenosis of left breast: Secondary | ICD-10-CM

## 2015-10-23 DIAGNOSIS — N6489 Other specified disorders of breast: Secondary | ICD-10-CM | POA: Diagnosis present

## 2015-10-23 HISTORY — PX: BREAST LUMPECTOMY WITH RADIOACTIVE SEED LOCALIZATION: SHX6424

## 2015-10-23 SURGERY — BREAST LUMPECTOMY WITH RADIOACTIVE SEED LOCALIZATION
Anesthesia: General | Site: Breast | Laterality: Left

## 2015-10-23 MED ORDER — DEXAMETHASONE SODIUM PHOSPHATE 4 MG/ML IJ SOLN
INTRAMUSCULAR | Status: DC | PRN
  Administered 2015-10-23: 10 mg via INTRAVENOUS

## 2015-10-23 MED ORDER — FENTANYL CITRATE (PF) 100 MCG/2ML IJ SOLN
INTRAMUSCULAR | Status: AC
  Filled 2015-10-23: qty 2

## 2015-10-23 MED ORDER — FENTANYL CITRATE (PF) 100 MCG/2ML IJ SOLN
INTRAMUSCULAR | Status: DC | PRN
  Administered 2015-10-23 (×4): 50 ug via INTRAVENOUS
  Administered 2015-10-23: 100 ug via INTRAVENOUS

## 2015-10-23 MED ORDER — BUPIVACAINE-EPINEPHRINE (PF) 0.25% -1:200000 IJ SOLN
INTRAMUSCULAR | Status: AC
  Filled 2015-10-23: qty 30

## 2015-10-23 MED ORDER — CHLORHEXIDINE GLUCONATE 4 % EX LIQD: 1.0000 "application " | Freq: Once | CUTANEOUS | Status: DC

## 2015-10-23 MED ORDER — SCOPOLAMINE 1 MG/3DAYS TD PT72: 1.0000 | MEDICATED_PATCH | Freq: Once | TRANSDERMAL | Status: DC | PRN

## 2015-10-23 MED ORDER — ONDANSETRON HCL 4 MG/2ML IJ SOLN
INTRAMUSCULAR | Status: AC
  Filled 2015-10-23: qty 2

## 2015-10-23 MED ORDER — OXYCODONE-ACETAMINOPHEN 5-325 MG PO TABS: 1.0000 | ORAL_TABLET | ORAL | Status: DC | PRN

## 2015-10-23 MED ORDER — DEXAMETHASONE SODIUM PHOSPHATE 10 MG/ML IJ SOLN
INTRAMUSCULAR | Status: AC
  Filled 2015-10-23: qty 1

## 2015-10-23 MED ORDER — LIDOCAINE HCL (CARDIAC) 20 MG/ML IV SOLN
INTRAVENOUS | Status: AC
  Filled 2015-10-23: qty 5

## 2015-10-23 MED ORDER — FENTANYL CITRATE (PF) 100 MCG/2ML IJ SOLN: 50.0000 ug | INTRAMUSCULAR | Status: DC | PRN

## 2015-10-23 MED ORDER — PHENYLEPHRINE HCL 10 MG/ML IJ SOLN
INTRAMUSCULAR | Status: AC
  Filled 2015-10-23: qty 1

## 2015-10-23 MED ORDER — LIDOCAINE HCL (CARDIAC) 20 MG/ML IV SOLN
INTRAVENOUS | Status: DC | PRN
  Administered 2015-10-23: 50 mg via INTRAVENOUS

## 2015-10-23 MED ORDER — MIDAZOLAM HCL 5 MG/5ML IJ SOLN
INTRAMUSCULAR | Status: DC | PRN
  Administered 2015-10-23: 2 mg via INTRAVENOUS

## 2015-10-23 MED ORDER — ONDANSETRON HCL 4 MG/2ML IJ SOLN: 4.0000 mg | Freq: Once | INTRAMUSCULAR | Status: DC | PRN

## 2015-10-23 MED ORDER — VANCOMYCIN HCL IN DEXTROSE 1-5 GM/200ML-% IV SOLN
1000.0000 mg | INTRAVENOUS | Status: AC
  Administered 2015-10-23: 1000 mg via INTRAVENOUS

## 2015-10-23 MED ORDER — VANCOMYCIN HCL IN DEXTROSE 1-5 GM/200ML-% IV SOLN
INTRAVENOUS | Status: AC
  Filled 2015-10-23: qty 200

## 2015-10-23 MED ORDER — BUPIVACAINE HCL (PF) 0.25 % IJ SOLN
INTRAMUSCULAR | Status: DC | PRN
  Administered 2015-10-23 (×2): 10 mL

## 2015-10-23 MED ORDER — GLYCOPYRROLATE 0.2 MG/ML IJ SOLN: 0.2000 mg | Freq: Once | INTRAMUSCULAR | Status: DC | PRN

## 2015-10-23 MED ORDER — OXYCODONE HCL 5 MG PO TABS
5.0000 mg | ORAL_TABLET | Freq: Once | ORAL | Status: AC | PRN
  Administered 2015-10-23: 5 mg via ORAL

## 2015-10-23 MED ORDER — MIDAZOLAM HCL 2 MG/2ML IJ SOLN
INTRAMUSCULAR | Status: AC
  Filled 2015-10-23: qty 2

## 2015-10-23 MED ORDER — ONDANSETRON HCL 4 MG/2ML IJ SOLN
INTRAMUSCULAR | Status: DC | PRN
  Administered 2015-10-23 (×2): 4 mg via INTRAVENOUS

## 2015-10-23 MED ORDER — HYDROMORPHONE HCL 1 MG/ML IJ SOLN: 0.5000 mg | INTRAMUSCULAR | Status: DC | PRN

## 2015-10-23 MED ORDER — PROPOFOL 10 MG/ML IV BOLUS
INTRAVENOUS | Status: DC | PRN
  Administered 2015-10-23: 100 mg via INTRAVENOUS
  Administered 2015-10-23: 50 mg via INTRAVENOUS
  Administered 2015-10-23: 100 mg via INTRAVENOUS
  Administered 2015-10-23: 300 mg via INTRAVENOUS

## 2015-10-23 MED ORDER — LACTATED RINGERS IV SOLN
INTRAVENOUS | Status: DC
  Administered 2015-10-23 (×2): via INTRAVENOUS

## 2015-10-23 MED ORDER — OXYCODONE HCL 5 MG PO TABS
ORAL_TABLET | ORAL | Status: AC
  Filled 2015-10-23: qty 1

## 2015-10-23 MED ORDER — PHENYLEPHRINE HCL 10 MG/ML IJ SOLN
INTRAMUSCULAR | Status: DC | PRN
  Administered 2015-10-23: 40 ug via INTRAVENOUS

## 2015-10-23 MED ORDER — PROPOFOL 10 MG/ML IV BOLUS
INTRAVENOUS | Status: AC
  Filled 2015-10-23: qty 40

## 2015-10-23 MED ORDER — MIDAZOLAM HCL 2 MG/2ML IJ SOLN: 1.0000 mg | INTRAMUSCULAR | Status: DC | PRN

## 2015-10-23 SURGICAL SUPPLY — 42 items
APPLIER CLIP 9.375 MED OPEN (MISCELLANEOUS)
APR CLP MED 9.3 20 MLT OPN (MISCELLANEOUS)
BLADE SURG 15 STRL LF DISP TIS (BLADE) ×1 IMPLANT
BLADE SURG 15 STRL SS (BLADE) ×3
CANISTER SUC SOCK COL 7IN (MISCELLANEOUS) ×1 IMPLANT
CANISTER SUCT 1200ML W/VALVE (MISCELLANEOUS) ×3 IMPLANT
CHLORAPREP W/TINT 26ML (MISCELLANEOUS) ×3 IMPLANT
CLIP APPLIE 9.375 MED OPEN (MISCELLANEOUS) IMPLANT
COVER BACK TABLE 60X90IN (DRAPES) ×3 IMPLANT
COVER MAYO STAND STRL (DRAPES) ×3 IMPLANT
COVER PROBE W GEL 5X96 (DRAPES) ×3 IMPLANT
DECANTER SPIKE VIAL GLASS SM (MISCELLANEOUS) IMPLANT
DEVICE DUBIN W/COMP PLATE 8390 (MISCELLANEOUS) ×3 IMPLANT
DRAPE LAPAROSCOPIC ABDOMINAL (DRAPES) ×2 IMPLANT
DRAPE UTILITY XL STRL (DRAPES) ×3 IMPLANT
ELECT COATED BLADE 2.86 ST (ELECTRODE) ×3 IMPLANT
ELECT REM PT RETURN 9FT ADLT (ELECTROSURGICAL) ×3
ELECTRODE REM PT RTRN 9FT ADLT (ELECTROSURGICAL) ×1 IMPLANT
GLOVE BIO SURGEON STRL SZ7.5 (GLOVE) ×6 IMPLANT
GLOVE BIOGEL PI IND STRL 7.0 (GLOVE) IMPLANT
GLOVE BIOGEL PI INDICATOR 7.0 (GLOVE) ×6
GLOVE ECLIPSE 6.5 STRL STRAW (GLOVE) ×2 IMPLANT
GOWN STRL REUS W/ TWL LRG LVL3 (GOWN DISPOSABLE) ×2 IMPLANT
GOWN STRL REUS W/TWL LRG LVL3 (GOWN DISPOSABLE) ×6
KIT MARKER MARGIN INK (KITS) ×3 IMPLANT
LIQUID BAND (GAUZE/BANDAGES/DRESSINGS) ×3 IMPLANT
NDL HYPO 25X1 1.5 SAFETY (NEEDLE) IMPLANT
NEEDLE HYPO 25X1 1.5 SAFETY (NEEDLE) ×3 IMPLANT
NS IRRIG 1000ML POUR BTL (IV SOLUTION) ×2 IMPLANT
PACK BASIN DAY SURGERY FS (CUSTOM PROCEDURE TRAY) ×3 IMPLANT
PENCIL BUTTON HOLSTER BLD 10FT (ELECTRODE) ×3 IMPLANT
SLEEVE SCD COMPRESS KNEE MED (MISCELLANEOUS) ×3 IMPLANT
SPONGE LAP 18X18 X RAY DECT (DISPOSABLE) ×3 IMPLANT
SUT MON AB 4-0 PC3 18 (SUTURE) ×2 IMPLANT
SUT SILK 2 0 SH (SUTURE) IMPLANT
SUT VICRYL 3-0 CR8 SH (SUTURE) ×3 IMPLANT
SYR CONTROL 10ML LL (SYRINGE) IMPLANT
TOWEL OR 17X24 6PK STRL BLUE (TOWEL DISPOSABLE) ×3 IMPLANT
TOWEL OR NON WOVEN STRL DISP B (DISPOSABLE) ×3 IMPLANT
TUBE CONNECTING 20'X1/4 (TUBING) ×1
TUBE CONNECTING 20X1/4 (TUBING) ×2 IMPLANT
YANKAUER SUCT BULB TIP NO VENT (SUCTIONS) ×2 IMPLANT

## 2015-10-23 NOTE — Transfer of Care (Signed)
Immediate Anesthesia Transfer of Care Note  Patient: Amber Barajas  Procedure(s) Performed: Procedure(s): BREAST LUMPECTOMY WITH RADIOACTIVE SEED LOCALIZATION (Left)  Patient Location: PACU  Anesthesia Type:General  Level of Consciousness: awake and patient cooperative  Airway & Oxygen Therapy: Patient Spontanous Breathing and Patient connected to face mask oxygen  Post-op Assessment: Report given to RN and Post -op Vital signs reviewed and stable  Post vital signs: Reviewed and stable  Last Vitals:  Filed Vitals:   10/23/15 0824 10/23/15 1053  BP: 129/82 137/96  Pulse: 94 97  Temp: 36.8 C   Resp: 16 12    Complications: No apparent anesthesia complications

## 2015-10-23 NOTE — Interval H&P Note (Signed)
History and Physical Interval Note:  10/23/2015 8:53 AM  Amber Barajas  has presented today for surgery, with the diagnosis of LEFT BREAST SCLEROSING LESION  The various methods of treatment have been discussed with the patient and family. After consideration of risks, benefits and other options for treatment, the patient has consented to  Procedure(s): BREAST LUMPECTOMY WITH RADIOACTIVE SEED LOCALIZATION (Left) as a surgical intervention .  The patient's history has been reviewed, patient examined, no change in status, stable for surgery.  I have reviewed the patient's chart and labs.  Questions were answered to the patient's satisfaction.     TOTH III,PAUL S

## 2015-10-23 NOTE — Anesthesia Preprocedure Evaluation (Signed)
Anesthesia Evaluation  Patient identified by MRN, date of birth, ID band Patient awake    Reviewed: Allergy & Precautions, NPO status , Patient's Chart, lab work & pertinent test results  Airway Mallampati: I  TM Distance: >3 FB     Dental   Pulmonary    Pulmonary exam normal        Cardiovascular Normal cardiovascular exam     Neuro/Psych Anxiety Depression    GI/Hepatic   Endo/Other    Renal/GU      Musculoskeletal  (+) Arthritis ,   Abdominal   Peds  Hematology   Anesthesia Other Findings Chronic Fatigue  Reproductive/Obstetrics                             Anesthesia Physical Anesthesia Plan  ASA: II  Anesthesia Plan: General   Post-op Pain Management:    Induction: Intravenous  Airway Management Planned: LMA and Oral ETT  Additional Equipment:   Intra-op Plan:   Post-operative Plan: Extubation in OR  Informed Consent: I have reviewed the patients History and Physical, chart, labs and discussed the procedure including the risks, benefits and alternatives for the proposed anesthesia with the patient or authorized representative who has indicated his/her understanding and acceptance.     Plan Discussed with: CRNA, Anesthesiologist and Surgeon  Anesthesia Plan Comments:         Anesthesia Quick Evaluation

## 2015-10-23 NOTE — Op Note (Signed)
10/23/2015  11:06 AM  PATIENT:  Amber Barajas  45 y.o. female  PRE-OPERATIVE DIAGNOSIS:  LEFT BREAST SCLEROSING LESION  POST-OPERATIVE DIAGNOSIS:  LEFT BREAST SCLEROSING LESION  PROCEDURE:  Procedure(s): BREAST LUMPECTOMY WITH RADIOACTIVE SEED LOCALIZATION (Left)  SURGEON:  Surgeon(s) and Role:    * Jovita Kussmaul, MD - Primary  PHYSICIAN ASSISTANT:   ASSISTANTS: none   ANESTHESIA:   general  EBL:  Total I/O In: 1000 [I.V.:1000] Out: -   BLOOD ADMINISTERED:none  DRAINS: none   LOCAL MEDICATIONS USED:  MARCAINE     SPECIMEN:  Source of Specimen:  left breast tissue  DISPOSITION OF SPECIMEN:  PATHOLOGY  COUNTS:  YES  TOURNIQUET:  * No tourniquets in log *  DICTATION: .Dragon Dictation   After informed consent was obtained the patient was brought to the operating room and placed in the supine position on the operating room table. After adequate induction of general anesthesia the patient's left breast was prepped with ChloraPrep, allowed to dry, and draped in usual sterile manner.Previously and I-125 seed was placed in the upper inner quadrant of the left breast to mark an area of a complex sclerosing lesion. The neoprobe was set to I-125 in the area of radioactivity was identified in the upper inner left breast. A curvilinear incision was made along the upper inner portion of the edge of the areola 15 blade knife. The incision was carried through the skin and subcutaneous tissue sharply with the electrocautery. While checking the area of radioactivity frequently with the neoprobe a circular portion of breast tissue was excised sharply around the radioactive seed with the electrocautery. Once the specimen was removed was oriented with the appropriate paint colors. A specimen radiograph was obtained that showed the clip and seed to be in the specimen. The specimen was then sent to pathology for further evaluation. Hemostasis was achieved using the Bovie electrocautery. The  wound was infiltrated with quarter percent Marcaine and irrigated with copious amounts of saline. The deep layer of the wound was then closed with interrupted 3-0 Vicryl stitches in layers. The skin was then closed with interrupted 4-0 Monocryl subcuticular stitches. Dermabond dressings were applied. The patient tolerated the procedure well. At the end of the case all needle sponge and instrument counts were correct. The patient was then awakened and taken to recovery in stable condition.  PLAN OF CARE: Discharge to home after PACU  PATIENT DISPOSITION:  PACU - hemodynamically stable.   Delay start of Pharmacological VTE agent (>24hrs) due to surgical blood loss or risk of bleeding: not applicable

## 2015-10-23 NOTE — Anesthesia Postprocedure Evaluation (Signed)
Anesthesia Post Note  Patient: Amber Barajas  Procedure(s) Performed: Procedure(s) (LRB): BREAST LUMPECTOMY WITH RADIOACTIVE SEED LOCALIZATION (Left)  Patient location during evaluation: PACU Anesthesia Type: General Level of consciousness: awake Pain management: pain level controlled Vital Signs Assessment: post-procedure vital signs reviewed and stable Respiratory status: spontaneous breathing and respiratory function stable Cardiovascular status: stable and blood pressure returned to baseline Postop Assessment: no headache Anesthetic complications: no    Last Vitals:  Filed Vitals:   10/23/15 1130 10/23/15 1145  BP: 140/97 140/94  Pulse: 87 83  Temp:  36.7 C  Resp: 10 14    Last Pain:  Filed Vitals:   10/23/15 1202  PainSc: 3                  Juanita Devincent EDWARD

## 2015-10-23 NOTE — Discharge Instructions (Signed)

## 2015-10-23 NOTE — H&P (Signed)
York Spaniel  Location: [facility] Patient #: [id number] DOB: 09-16-1971 Married / Language: English / Race: White Female   History of Present Illness  Patient words: left breast.  The patient is a 45 year old female who presents with a breast mass. We are asked to see the patient in consultation by Dr. Shelly Bombard to evaluate her for a left breast complex sclerosing lesion. The patient is a 45 year old white female who recently went for a routine screening mammogram. At that time she was found to have an area of distortion in the upper inner left breast. This was biopsied and came back as a complex sclerosing lesion. She had a similar finding in the right breast about 2 years ago. At that time she declined a referral to the high-risk clinic. She states that she would be agreeable to this this time. She denies any breast pain or discharge from the nipple.   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 Breast Mass; Local Excision Right. Gallbladder Surgery - Laparoscopic Oral Surgery Tonsillectomy  Diagnostic Studies History  Colonoscopy never Mammogram within last year Pap Smear 1-5 years ago  Allergies  PenicillAMINE *ASSORTED CLASSES*  Medication History  Xanax (1MG  Tablet, Oral) Active. Vitamin C (1000MG  Tablet, Oral) Active. Vitamin D (Cholecalciferol) (1000UNIT Tablet, Oral) Active. KlonoPIN (0.5MG  Tablet, Oral) Active. Selenicaps-200 (200MCG Capsule, Oral) Active. Thyroid (81.25MG  Tablet, Oral) Active. Vitamin B Complex (Oral) Active. Vitamin E (400UNIT Capsule, Oral) Active. Probiotic Daily (Oral) Active. Meloxicam (15MG  Tablet, Oral) Active. Magnesium (200MG  Tablet, Oral) Active. Lysine (500MG  Capsule, Oral) Active. Coenzyme Q10 (100MG  Capsule, Oral) Active. Vitamin D (1000UNIT Tablet, Oral) Active. Medications Reconciled  Social History  Alcohol  use Occasional alcohol use. Caffeine use Tea. No drug use Tobacco use Never smoker.  Family History  Alcohol Abuse Sister. Cancer Father. Depression Mother, Sister.  Pregnancy / Birth History  Age at menarche 48 years. Contraceptive History Contraceptive implant, Oral contraceptives. Gravida 0 Para 0 Regular periods    Review of Systems General Present- Fatigue, Night Sweats and Weight Gain. Not Present- Appetite Loss, Chills, Fever and Weight Loss. Skin Not Present- Change in Wart/Mole, Dryness, Hives, Jaundice, New Lesions, Non-Healing Wounds, Rash and Ulcer. HEENT Not Present- Earache, Hearing Loss, Hoarseness, Nose Bleed, Oral Ulcers, Ringing in the Ears, Seasonal Allergies, Sinus Pain, Sore Throat, Visual Disturbances, Wears glasses/contact lenses and Yellow Eyes. Respiratory Not Present- Bloody sputum, Chronic Cough, Difficulty Breathing, Snoring and Wheezing. Breast Present- Breast Mass. Not Present- Breast Pain, Nipple Discharge and Skin Changes. Cardiovascular Not Present- Chest Pain, Difficulty Breathing Lying Down, Leg Cramps, Palpitations, Rapid Heart Rate, Shortness of Breath and Swelling of Extremities. Gastrointestinal Not Present- Abdominal Pain, Bloating, Bloody Stool, Change in Bowel Habits, Chronic diarrhea, Constipation, Difficulty Swallowing, Excessive gas, Gets full quickly at meals, Hemorrhoids, Indigestion, Nausea, Rectal Pain and Vomiting. Female Genitourinary Not Present- Frequency, Nocturia, Painful Urination, Pelvic Pain and Urgency. Musculoskeletal Not Present- Back Pain, Joint Pain, Joint Stiffness, Muscle Pain, Muscle Weakness and Swelling of Extremities. Neurological Not Present- Decreased Memory, Fainting, Headaches, Numbness, Seizures, Tingling, Tremor, Trouble walking and Weakness. Psychiatric Present- Anxiety and Depression. Not Present- Bipolar, Change in Sleep Pattern, Fearful and Frequent crying. Endocrine Present- Heat Intolerance.  Not Present- Cold Intolerance, Excessive Hunger, Hair Changes, Hot flashes and New Diabetes. Hematology Not Present- Easy Bruising, Excessive bleeding, Gland problems, HIV and Persistent Infections.  Vitals Weight: 181 lb Height: 67in Body Surface Area: 1.94 m Body Mass Index: 28.35  kg/m  Temp.: 43F(Temporal)  Pulse: 77 (Regular)  BP: 130/80 (Sitting, Left Arm, Standard)       Physical Exam  General Mental Status-Alert. General Appearance-Consistent with stated age. Hydration-Well hydrated. Voice-Normal.  Head and Neck Head-normocephalic, atraumatic with no lesions or palpable masses. Trachea-midline. Thyroid Gland Characteristics - normal size and consistency.  Eye Eyeball - Bilateral-Extraocular movements intact. Sclera/Conjunctiva - Bilateral-No scleral icterus.  Chest and Lung Exam Chest and lung exam reveals -quiet, even and easy respiratory effort with no use of accessory muscles and on auscultation, normal breath sounds, no adventitious sounds and normal vocal resonance. Inspection Chest Wall - Normal. Back - normal.  Breast Note: There is no palpable mass in either breast. There is no palpable axillary, supraclavicular, or cervical lymphadenopathy.   Cardiovascular Cardiovascular examination reveals -normal heart sounds, regular rate and rhythm with no murmurs and normal pedal pulses bilaterally.  Abdomen Inspection Inspection of the abdomen reveals - No Hernias. Skin - Scar - no surgical scars. Palpation/Percussion Palpation and Percussion of the abdomen reveal - Soft, Non Tender, No Rebound tenderness, No Rigidity (guarding) and No hepatosplenomegaly. Auscultation Auscultation of the abdomen reveals - Bowel sounds normal.  Neurologic Neurologic evaluation reveals -alert and oriented x 3 with no impairment of recent or remote memory. Mental Status-Normal.  Musculoskeletal Normal Exam - Left-Upper Extremity  Strength Normal and Lower Extremity Strength Normal. Normal Exam - Right-Upper Extremity Strength Normal and Lower Extremity Strength Normal.  Lymphatic Head & Neck  General Head & Neck Lymphatics: Bilateral - Description - Normal. Axillary  General Axillary Region: Bilateral - Description - Normal. Tenderness - Non Tender. Femoral & Inguinal  Generalized Femoral & Inguinal Lymphatics: Bilateral - Description - Normal. Tenderness - Non Tender.    Assessment & Plan  SCLEROSING ADENOSIS OF BREAST, LEFT (N60.22) Impression: The patient appears to have a complex sclerosing lesion in the upper inner left breast. Because of its appearance on mammogram and because it is considered a high risk lesion I would recommend having this area removed. I have discussed with her in detail the risks and benefits of the operation to do this as well as some of the technical aspects and she understands and wishes to proceed. I will plan for a left breast radioactive seed localized lumpectomy    Signed by Luella Cook, MD

## 2015-10-23 NOTE — Anesthesia Procedure Notes (Signed)
Procedure Name: LMA Insertion Date/Time: 10/23/2015 10:03 AM Performed by: Toula Moos L Pre-anesthesia Checklist: Patient identified, Emergency Drugs available, Suction available, Patient being monitored and Timeout performed Patient Re-evaluated:Patient Re-evaluated prior to inductionOxygen Delivery Method: Circle System Utilized Preoxygenation: Pre-oxygenation with 100% oxygen Intubation Type: IV induction Ventilation: Mask ventilation without difficulty LMA: LMA inserted LMA Size: 4.0 Number of attempts: 1 Airway Equipment and Method: Bite block Placement Confirmation: positive ETCO2 Tube secured with: Tape Dental Injury: Teeth and Oropharynx as per pre-operative assessment

## 2015-10-24 ENCOUNTER — Encounter (HOSPITAL_BASED_OUTPATIENT_CLINIC_OR_DEPARTMENT_OTHER): Payer: Self-pay | Admitting: General Surgery

## 2015-11-07 ENCOUNTER — Ambulatory Visit (INDEPENDENT_AMBULATORY_CARE_PROVIDER_SITE_OTHER): Payer: 59 | Admitting: Internal Medicine

## 2015-11-07 ENCOUNTER — Encounter: Payer: Self-pay | Admitting: Internal Medicine

## 2015-11-07 VITALS — BP 102/70 | HR 91 | Temp 97.9°F | Resp 12 | Wt 173.8 lb

## 2015-11-07 DIAGNOSIS — E039 Hypothyroidism, unspecified: Secondary | ICD-10-CM

## 2015-11-07 LAB — TSH: TSH: 0.91 u[IU]/mL (ref 0.35–4.50)

## 2015-11-07 LAB — T4, FREE: Free T4: 0.69 ng/dL (ref 0.60–1.60)

## 2015-11-07 LAB — T3, FREE: T3, Free: 3.3 pg/mL (ref 2.3–4.2)

## 2015-11-07 MED ORDER — THYROID 81.25 MG PO TABS: ORAL_TABLET | ORAL | Status: DC

## 2015-11-07 NOTE — Patient Instructions (Signed)
Please stop at the lab.  Please come back for a follow-up appointment in 6 months.  

## 2015-11-07 NOTE — Progress Notes (Signed)
Patient ID: Amber Barajas, female   DOB: April 25, 1971, 45 y.o.   MRN: [id number]   HPI  Amber Barajas is a 45 y.o.-year-old female, returning for follow-up for hypothyroidism. Last visit 3 months ago.  Reviewed hx: She has been diagnosed with hypothyroidism in 2012. She started to have sxs in 2008. She had weight gain, heavy periods (had endom. Ablation - now normal periods). She then saw an endocrinologist >> Rx'ed Synthroid. She did not feel good.  Patient has been seen at [facility], and she has been on Naturethroid 1.25 gr twice a day (equivalent levothyroxine dose of 250 g daily) at last visit on 07/08/2015. She was also on Lugol's iodine solution 2 drops daily (stopped). Of note, patient was on lithium orotate daily at bedtime - for improved sleep and relaxation (stopped).  She [facility], however, TSH levels have not been checked, her thyroid hormone dose adjustments were made based on free T3 and free T4 (!)  Reviewed the records from her [place]: Labs on 06/20/2015:   free T3 3.1 (2-4.4), free T4 0.77 (0.82-1.77), TPO antibodies 14 (0-34), ATA <1.0 (0-0.9) Previous labs on 02/14/2015:  Free T3 3.1 (2-4.4), free T4 0.76 (0.82-1.77)  Latest levels reviewed: Component     Latest Ref Rng 08/07/2015 09/18/2015  T3, Free     2.3 - 4.2 pg/mL 4.7 (H) 3.7  Free T4     0.60 - 1.60 ng/dL 0.69 0.59 (L)  TSH     0.35 - 4.50 uIU/mL 0.09 (L) 0.64   As her TSH was very suppressed at last visit >> we decreased her dose of Naturethyroid to 1.25 gr in a.m. and 0.5 gr in p.m. (TDD equivalent of 175 mcg LT4 daily). F/u TFTs were normal.  She takes the thyroid hormones: - First dose fasting and second dose at 4 pm - with water - separated by >30 min from b'fast  - no calcium, iron, PPIs, multivitamins  (+ Biotin (did not take it in 2 days) - with dinner and a snack in am (>1h)   Pt  describes: - no weight loss - + no hair loss - + fatigue (she was diagnosed with chronic fatigue syndrome) - + still heat intolerance - less palpitations - + anxiety/depression depression - + diarrhea/ no constipation - no dry skin - no hair loss  Pt denies feeling nodules in neck, hoarseness, dysphagia/odynophagia, SOB with lying down.  She has no FH of thyroid disorders. No FH of thyroid cancer.  No h/o radiation tx to head or neck.  I reviewed her records and she also has a h/o of: Anxiety (GAD), alcoholism - now with better control of cravings. 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 She is on methylated vitamins. She is on multiple other supplements.  She stopped: testosterone, pregnenolone, DHEA, and we have a new hormonal evaluation with her naturopath.  ROS: Constitutional: + see HPI Eyes: no blurry vision, no xerophthalmia ENT: no sore throat, no nodules palpated in throat, no dysphagia/no odynophagia, no hoarseness Cardiovascular: no CP/SOB/palpitations/no leg swelling Respiratory: no cough/SOB Gastrointestinal: + N/no V/+D/no C Musculoskeletal: no muscle/joint aches Skin: no rashes, no easy bruising Neurological: no tremors/numbness/tingling/dizziness, no lightheadedness/vertigo  I reviewed pt's medications, allergies, PMH, social hx, family hx, and changes were documented in the history of present illness. Otherwise, unchanged from my initial visit note.  Past Medical History  Diagnosis Date  . Thyroid disease   .  Perimenopausal   . Depression 12/22/12  . 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 Anxiety   . Alcoholism /alcohol abuse ([place]) 11/20/14  . 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 Chronic fatigue syndrome 07/08/15  . Tick bite 07/08/15   Past Surgical History  Procedure Laterality Date  . Novasure ablation    . Essure tubal ligation    . Cholecystectomy  2011    lap choli  . Wisdom tooth extraction  2003  .  Tonsillectomy    . Breast lumpectomy Right 08/01/2013    Procedure: LUMPECTOMY;  Surgeon: Merrie Roof, MD;  Location: [facility];  Service: General;  Laterality: Right;  . Breast lumpectomy with radioactive seed localization Left 10/23/2015    Procedure: BREAST LUMPECTOMY WITH RADIOACTIVE SEED LOCALIZATION;  Surgeon: Autumn Messing III, MD;  Location: [facility];  Service: General;  Laterality: Left;   Social History   Social History  . Marital Status: Married    Spouse Name: N/A  . Number of Children: 0   Occupational History  . student   Social History Main Topics  . Smoking status: Never Smoker   . Smokeless tobacco: Never Used  . Alcohol Use: No  . Drug Use: No   Current Outpatient Prescriptions on File Prior to Visit  Medication Sig Dispense Refill  . Ascorbic Acid (VITAMIN C) 1000 MG tablet Take 2,000 mg by mouth 2 (two) times daily.    . ASHWAGANDHA PO Take by mouth. At bedtime    . cholecalciferol (VITAMIN D) 1000 UNITS tablet Take 1,000 Units by mouth daily. Takes 5000u    . clonazePAM (KLONOPIN) 0.5 MG tablet Take 0.5 mg by mouth 2 (two) times daily as needed for anxiety.    Marland Kitchen doxylamine, Sleep, (UNISOM) 25 MG tablet Take 25 mg by mouth at bedtime as needed.    Marland Kitchen L-Lysine 500 MG CAPS Take 1 capsule by mouth 2 (two) times daily.     . Magnesium 200 MG TABS Take 1 tablet by mouth at bedtime.    Marland Kitchen MILK THISTLE PO Take 1 capsule by mouth daily.    . NONFORMULARY OR COMPOUNDED ITEM Bioidentical Testosterone Cream 2%, take [id number] ml (2 clicks) behind knee daily.    Marland Kitchen OVER THE COUNTER MEDICATION 2 (two) times daily. [place]    . Pantothenic Acid 250 MG CAPS Take 1 capsule by mouth daily.    . Probiotic Product (PROBIOTIC ADVANCED) CAPS Take by mouth. 25 billion daily    . Selenium 200 MCG CAPS Take 1 capsule by mouth daily.    . Thyroid 81.25 MG TABS Take 1.25 grain in am and 0.5 grain in pm 30 tablet 1  . vitamin B-12 (CYANOCOBALAMIN) 1000 MCG  tablet Take 1,000 mcg by mouth daily.    . vitamin E 400 UNIT capsule Take 400 Units by mouth daily.     No current facility-administered medications on file prior to visit.   Allergies  Allergen Reactions  . Penicillins Hives and Itching  . Tape    Family History  Problem Relation Age of Onset  . Cancer Father     lung  . Cancer Paternal Uncle     brain   PE: BP 102/70 mmHg  Pulse 91  Temp(Src) 97.9 F (36.6 C) (Oral)  Resp 12  Wt 173 lb 12.8 oz (78.835 kg)  SpO2 97% Body mass index is 27.21 kg/(m^2).  Wt Readings from Last 3 Encounters:  11/07/15 173 lb 12.8 oz (78.835 kg)  10/23/15 175 lb 2 oz (79.436 kg)  08/07/15 178 lb 9.6 oz (81.012 kg)   Constitutional: overweight, in NAD Eyes: PERRLA, EOMI, no exophthalmos ENT: moist mucous membranes, no thyromegaly, no cervical lymphadenopathy Cardiovascular: RRR, No MRG Respiratory: CTA B Gastrointestinal: abdomen soft, NT, ND, BS+ Musculoskeletal: no deformities, strength intact in all 4 Skin: moist, warm, no rashes Neurological: Very fine tremor with outstretched hands, DTR normal in all 4  ASSESSMENT: 1. Hypothyroidism  PLAN:  1. Patient with long-standing hypothyroidism, previously on high dose desiccated thyroid extract managed by [facility]. At last visit, she appeared restless, had tremor with outstretched hands and complained of palpitations. We discussed at length with patient and her husband about the fact that hypothyroidism has to be followed by TSH which is a better test to assess the thyroid replacement compared to free T4 and free T3, which are very dependent on the timing of her NT dose compared to the time of the lab draw. Indeed, TSH returned very suppressed, after which her Naturethroid dose was decreased with subsequence normal thyroid tests. - As the TFTs normalized, she is not restless anymore, her tremors are almost resolved and she rarely has palpitations. She continues to have fatigue, which  is an ongoing problem for the last 8 years. She is working with a naturopath at the moment at is in the process of being tested for rheumatologic conditions. - We discussed about correct intake of levothyroxine, fasting, with water, separated by at least 30 minutes from breakfast, and separated by more than 4 hours from calcium, iron, multivitamins, acid reflux medications (PPIs). She is taking it correctly, however, the first dose of multivitamins is approximately 1-2 hours after the first dose of Naturethroid. - will check thyroid tests today: TSH, free T4, free T4 - We'll continue the current dose of Naturethroid of 1.25 gr in a.m. and 0.5 gr in p.m. (TDD equivalent of 175 mcg LT4 daily). She did not take her biotin in today. - If labs are abnormal, she will need to return in 6 weeks for repeat labs - If these are normal, I will see her back in 6 months  Needs refills when labs are back.  Office Visit on 11/07/2015  Component Date Value Ref Range Status  . Free T4 11/07/2015 0.69  0.60 - 1.60 ng/dL Final  . T3, Free 11/07/2015 3.3  2.3 - 4.2 pg/mL Final  . TSH 11/07/2015 0.91  0.35 - 4.50 uIU/mL Final   Perfect thyroid tests!

## 2015-11-18 ENCOUNTER — Encounter: Payer: Self-pay | Admitting: Internal Medicine

## 2015-11-19 ENCOUNTER — Other Ambulatory Visit: Payer: Self-pay | Admitting: *Deleted

## 2015-11-19 MED ORDER — NATURE-THROID 32.5 MG PO TABS: ORAL_TABLET | ORAL | Status: DC

## 2015-11-19 MED ORDER — THYROID 81.25 MG PO TABS: ORAL_TABLET | ORAL | Status: DC

## 2015-11-26 ENCOUNTER — Telehealth: Payer: Self-pay | Admitting: Family Medicine

## 2015-11-26 NOTE — Telephone Encounter (Signed)
Pt is 45 years old and would like to get in to see dr hunter soon. Can I use phy slot?  Pt has [employer]

## 2015-11-26 NOTE — Telephone Encounter (Signed)
See below

## 2015-11-27 NOTE — Telephone Encounter (Signed)
yes

## 2015-11-27 NOTE — Telephone Encounter (Signed)
That's fine

## 2015-11-28 NOTE — Telephone Encounter (Signed)
Pt has been schedule.

## 2015-12-23 ENCOUNTER — Ambulatory Visit (INDEPENDENT_AMBULATORY_CARE_PROVIDER_SITE_OTHER): Payer: 59 | Admitting: Family Medicine

## 2015-12-23 ENCOUNTER — Encounter: Payer: Self-pay | Admitting: Family Medicine

## 2015-12-23 VITALS — BP 140/82 | HR 104 | Temp 99.1°F | Wt 178.0 lb

## 2015-12-23 DIAGNOSIS — Z23 Encounter for immunization: Secondary | ICD-10-CM | POA: Diagnosis not present

## 2015-12-23 DIAGNOSIS — R5382 Chronic fatigue, unspecified: Secondary | ICD-10-CM | POA: Diagnosis not present

## 2015-12-23 DIAGNOSIS — F1020 Alcohol dependence, uncomplicated: Secondary | ICD-10-CM | POA: Diagnosis not present

## 2015-12-23 DIAGNOSIS — IMO0001 Reserved for inherently not codable concepts without codable children: Secondary | ICD-10-CM

## 2015-12-23 DIAGNOSIS — F411 Generalized anxiety disorder: Secondary | ICD-10-CM

## 2015-12-23 DIAGNOSIS — F329 Major depressive disorder, single episode, unspecified: Secondary | ICD-10-CM | POA: Insufficient documentation

## 2015-12-23 DIAGNOSIS — F322 Major depressive disorder, single episode, severe without psychotic features: Secondary | ICD-10-CM | POA: Diagnosis not present

## 2015-12-23 MED ORDER — BUPROPION HCL ER (XL) 150 MG PO TB24: 150.0000 mg | ORAL_TABLET | Freq: Every day | ORAL | Status: DC

## 2015-12-23 NOTE — Progress Notes (Signed)
Garret Reddish, MD Phone: [phone]  Subjective:  Patient presents today to establish care. 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 Also sees a naturopath on 27th Jully Clarey. Klonopin was from [facility]. Chief complaint-noted.   See problem oriented charting  The following were reviewed and entered/updated in epic: Past Medical History  Diagnosis Date  . Thyroid disease   . Perimenopausal   . Depression 12/22/12  . 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 Anxiety   . Alcoholism /alcohol abuse ([place]) 11/20/14    3-4 per day at present  . 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 Chronic fatigue syndrome 07/08/15  . Tick bite 07/08/15   Patient Active Problem List   Diagnosis Date Noted  . Major depression ([place]) 12/23/2015    Priority: High  . Alcoholism /alcohol abuse ([place]) 11/20/2014    Priority: High  . Hypothyroidism 08/07/2015    Priority: Medium  . Chronic fatigue 08/07/2015    Priority: Medium  . GAD (generalized anxiety disorder) 12/23/2015    Priority: Low  . Osteoarthritis of right knee 12/20/2013    Priority: Low   Past Surgical History  Procedure Laterality Date  . Novasure ablation  2009  . Essure tubal ligation  2004  . Cholecystectomy  2011    lap choli  . Wisdom tooth extraction  2003  . Tonsillectomy  1977  . Breast lumpectomy Right 08/01/2013    benign  . Breast lumpectomy with radioactive seed localization Left 10/23/2015    benign  . Finger surgery  2014    dupuytren's contracture    Family History  Problem Relation Age of Onset  . Lung cancer Father 30    smoker  . Cancer Paternal Uncle     brain  . Depression Mother   . Alcoholism Sister   . Depression Sister     Medications- reviewed and updated Current Outpatient Prescriptions  Medication Sig Dispense Refill  . Ascorbic Acid (VITAMIN C) 1000 MG tablet Take 2,000 mg by mouth 2 (two) times daily.    . ASHWAGANDHA PO Take by  mouth. At bedtime    . cholecalciferol (VITAMIN D) 1000 UNITS tablet Take 1,000 Units by mouth daily. Takes 5000u    . clonazePAM (KLONOPIN) 0.5 MG tablet Take 0.5 mg by mouth 2 (two) times daily as needed for anxiety.    . Flaxseed, Linseed, (FLAXSEED OIL) 1200 MG CAPS Take by mouth.    . L-Lysine 500 MG CAPS Take 1 capsule by mouth 2 (two) times daily.     . Magnesium 200 MG TABS Take 1 tablet by mouth at bedtime.    Marland Kitchen MILK THISTLE PO Take 1 capsule by mouth daily.    Marland Kitchen NATURE-THROID 32.5 MG tablet Take 1.25 grain in am and 0.5 grain in pm as instructed. 135 tablet 1  . OVER THE COUNTER MEDICATION 2 (two) times daily. [place]    . Probiotic Product (PROBIOTIC ADVANCED) CAPS Take by mouth. 25 billion daily    . Selenium 200 MCG CAPS Take 1 capsule by mouth daily.    . Thyroid 81.25 MG TABS Take 1.25 grain in am and 0.5 grain in pm as instructed. 135 tablet 1  . zinc gluconate 50 MG tablet Take 50 mg by mouth daily.    Marland Kitchen doxylamine, Sleep, (UNISOM) 25 MG tablet Take 25 mg by mouth at bedtime as needed. Reported on 12/23/2015    . vitamin B-12 (CYANOCOBALAMIN) 1000 MCG tablet Take 1,000 mcg  by mouth daily.     No current facility-administered medications for this visit.    Allergies-reviewed and updated Allergies  Allergen Reactions  . Penicillins Hives and Itching  . Tape     Social History   Social History  . Marital Status: Married    Spouse Name: N/A  . Number of Children: N/A  . Years of Education: N/A   Social History Main Topics  . Smoking status: Never Smoker   . Smokeless tobacco: Never Used  . Alcohol Use: 0.0 oz/week    0 Standard drinks or equivalent per week     Comment: alcoholic  . Drug Use: No  . Sexual Activity: Not Asked   Other Topics Concern  . None   Social History Narrative   Married. 2 cats. No children.       Currently unemployed- studying accounting. Husband in IT      Hobbies: reader, crafts, cooking, enjoys exercise when able (gets  nauseous/vomiting for 2 days after exercise and struggles to get out of bed)- better since seeing naturopath. Worse with stress.     ROS--Full ROS was completed Review of Systems  Constitutional: Positive for malaise/fatigue. Negative for fever and chills.  HENT: Negative for ear discharge and hearing loss.   Eyes: Negative for blurred vision and double vision.  Respiratory: Negative for cough and shortness of breath.   Cardiovascular: Negative for chest pain and palpitations.  Gastrointestinal: Positive for nausea and vomiting. Negative for diarrhea.  Genitourinary: Negative for dysuria and urgency.  Musculoskeletal: Positive for myalgias and joint pain. Negative for falls.  Skin: Negative for itching and rash.  Neurological: Positive for weakness. Negative for dizziness and tingling.  Endo/Heme/Allergies: Negative for polydipsia. Does not bruise/bleed easily.  Psychiatric/Behavioral: Positive for depression and substance abuse (alcohol). Negative for suicidal ideas and hallucinations.   Objective: BP 140/82 mmHg  Pulse 104  Temp(Src) 99.1 F (37.3 C)  Wt 178 lb (80.74 kg) Gen: NAD, resting comfortably in chair but does appear fatigued HEENT: Mucous membranes are moist. Oropharynx normal. TM normal. Eyes: sclera and lids normal, PERRLA Neck: no thyromegaly, no cervical lymphadenopathy CV: RRR no murmurs rubs or gallops Lungs: CTAB no crackles, wheeze, rhonchi Abdomen: soft/nontender/nondistended/normal bowel sounds. No rebound or guarding.  Ext: no edema Skin: warm, dry, no rash Neuro: 5/5 strength in upper and lower extremities, normal gait, normal reflexes  Assessment/Plan:  Alcoholism /alcohol abuse (HCC) S: At present drinks 3-4 alcoholic beverages a day. Has had long-term issues with alcohol. doesnt feel like she fits in with AA. one smart group one night a week on saturdaynight that she does like but timing doesn't work well. She has been alcohol free for up to 5 months  in the past A/P: I encouraged alcohol cessation. I do think patient self medicates for untreated depression so we will treat depression in hopes of improving her chances for success.   Chronic fatigue S:Symptoms include chronic fatigue, feeling overwhelmed, no appetite, nausea, throwing up with stress, brain fog. Dizzy/vertigo/nausea with stress. Feels awful for 2 days after exercise generally with vomiting after exercise. Has to do things in the morning. Crying spells 2-5 in afternoon and 2-4 in the morning. Started in 2008 or 2009.  Heavy periods were thought to potentially contribute so in 2009 had novasure but even with less bleeding still had same symptoms. started 2011 with thyroid treatment- didn't improve symptoms. 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 Most recently has stopped going  to [place]. For stress of this has seen Rolanda Lundborg who does hypnosis. Workup at [facility] has included tests for lyme, mycoplasma, and EBV- all showing IgG but no IgM and only IgG in limited patterns- thought potential chronic mycoplasma or EBV treatment and given alternative treatments- she did not feel comfortable so left clinic. Never treated for chronic lyme disease. Has been diagnosed with MTHFR mutation by integrative help and told "difficult to absorb vitamins" but really may be more potential deficiency concerns with folate, b12, b6, riboflavin  PHQ9 of 22 and GAD7 of 13 rates both as severely difficult. States prior providers were hesitant to use antidepressants. She had apparently been on lexapro in past though and did not tolerate with muscle twitching and thought racing. This was through psychiatry in [place]. She did have some counseling last year. She plans to return to [facility] who does some counseling as well as hypnosis. She does have a sister who has depression which has been controlled reasonably on Wellbutrin. Her sister has been on Lamictal  for mood stabilization but has not been diagnosed with bipolar or other. Has had some suicidal ideation in past years but not this year. Sometimes she thinks it would be better if she did not wake up but has no plans to hurt herself or make it so she does not wake up.  A/P: Unclear etiology for her chronic fatigue. Her thyroid is adequately treated so that should not be the cause of her fatigue.  She clearly has depression as well as anxiety with likely generalized anxiety disorder. We will treat with Wellbutrin 150 mg extended release. Follow-up in 2 weeks. I reviewed her prior labs done within the year with no obvious cause for fatigue. Could consider some rheumatologic workup with ANA as well as rheumatoid factor. Can discuss at follow-up. Extensive counseling provided. Extensive counseling provided-this has been very difficult for patient to deal with for nearly 8 years without clear answers. Could consider infectious disease consultation for concern about mycoplasma, Lyme, EBV. Scanning this document in.     Blood pressure mildly elevated-we discussed repeating a follow-up. Of note she was not tachycardic on my exam  2 weeks. Return precautions advised.   Orders Placed This Encounter  Procedures  . Tdap vaccine greater than or equal to 7yo IM    Meds ordered this encounter  . buPROPion (WELLBUTRIN XL) 150 MG 24 hr tablet    Sig: Take 1 tablet (150 mg total) by mouth daily.    Dispense:  30 tablet    Refill:  5   The duration of face-to-face time during this visit was 54 minutes (Evaluated patient from 2:15-3:24 but did spend 15 minutes with another patient during this time) Greater than 50% of this time was spent in counseling, explanation of diagnosis, planning of further management, and/or coordination of care.

## 2015-12-23 NOTE — Assessment & Plan Note (Signed)
S: At present drinks 3-4 alcoholic beverages a day. Has had long-term issues with alcohol. doesnt feel like she fits in with AA. one smart group one night a week on saturdaynight that she does like but timing doesn't work well. She has been alcohol free for up to 5 months in the past A/P: I encouraged alcohol cessation. I do think patient self medicates for untreated depression so we will treat depression in hopes of improving her chances for success.

## 2015-12-23 NOTE — Assessment & Plan Note (Signed)
S:Symptoms include chronic fatigue, feeling overwhelmed, no appetite, nausea, throwing up with stress, brain fog. Dizzy/vertigo/nausea with stress. Feels awful for 2 days after exercise generally with vomiting after exercise. Has to do things in the morning. Crying spells 2-5 in afternoon and 2-4 in the morning. Started in 2008 or 2009.  Heavy periods were thought to potentially contribute so in 2009 had novasure but even with less bleeding still had same symptoms. started 2011 with thyroid treatment- didn't improve symptoms. 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 Most recently has stopped going to [place]. For stress of this has seen Rolanda Lundborg who does hypnosis. Workup at [facility] has included tests for lyme, mycoplasma, and EBV- all showing IgG but no IgM and only IgG in limited patterns- thought potential chronic mycoplasma or EBV treatment and given alternative treatments- she did not feel comfortable so left clinic. Never treated for chronic lyme disease. Has been diagnosed with MTHFR mutation by integrative help and told "difficult to absorb vitamins" but really may be more potential deficiency concerns with folate, b12, b6, riboflavin  PHQ9 of 22 and GAD7 of 13 rates both as severely difficult. States prior providers were hesitant to use antidepressants. She had apparently been on lexapro in past though and did not tolerate with muscle twitching and thought racing. This was through psychiatry in [place]. She did have some counseling last year. She plans to return to [facility] who does some counseling as well as hypnosis. She does have a sister who has depression which has been controlled reasonably on Wellbutrin. Her sister has been on Lamictal for mood stabilization but has not been diagnosed with bipolar or other. Has had some suicidal ideation in past years but not this year. Sometimes she thinks it would be better if she did not wake up but has  no plans to hurt herself or make it so she does not wake up.  A/P: Unclear etiology for her chronic fatigue. Her thyroid is adequately treated so that should not be the cause of her fatigue.  She clearly has depression as well as anxiety with likely generalized anxiety disorder. We will treat with Wellbutrin 150 mg extended release. Follow-up in 2 weeks. I reviewed her prior labs done within the year with no obvious cause for fatigue. Could consider some rheumatologic workup with ANA as well as rheumatoid factor. Can discuss at follow-up. Extensive counseling provided. Extensive counseling provided-this has been very difficult for patient to deal with for nearly 8 years without clear answers. Could consider infectious disease consultation for concern about mycoplasma, Lyme, EBV. Scanning this document in.

## 2015-12-23 NOTE — Patient Instructions (Addendum)
TDAP received today.  Have copy of mammogram and pap results faxed to [place] at [phone].  Unclear etiology for many of your symptoms  One thing that is clear- burden from dealing with everything has led to both depression and anxiety (Generalized anxiety). We will treat this with wellbutrin 150mg  XL. Hopeful for at least 2 month trial before adjustments  May use clonazepam in first 2 weeks if anxiety increases on med short term  Follow up 28th or 29th if that works for you (have them put you in one of the 30 minute slots)    Taking the medicine as directed and not missing any doses is one of the best things you can do to treat your depression.  Here are some things to keep in mind:  1) Side effects (stomach upset, some increased anxiety) may happen before you notice a benefit.  These side effects typically go away over time. 2) Changes to your dose of medicine or a change in medication all together is sometimes necessary 3) Most people need to be on medication at least 6-12 months. I suspect longterm need for you. Many people will notice an improvement within two weeks but the full effect of the medication can take up to 4-6 weeks 4) Stopping the medication when you start feeling better often results in a return of symptoms 5) If you start having thoughts of hurting yourself or others after starting this medicine, call our office immediately at [phone] or seek care through 911.

## 2015-12-25 ENCOUNTER — Encounter: Payer: Self-pay | Admitting: Family Medicine

## 2016-01-07 ENCOUNTER — Encounter: Payer: Self-pay | Admitting: Family Medicine

## 2016-01-07 ENCOUNTER — Ambulatory Visit (INDEPENDENT_AMBULATORY_CARE_PROVIDER_SITE_OTHER): Payer: 59 | Admitting: Family Medicine

## 2016-01-07 VITALS — BP 128/96 | HR 98 | Temp 99.0°F | Wt 175.0 lb

## 2016-01-07 DIAGNOSIS — F322 Major depressive disorder, single episode, severe without psychotic features: Secondary | ICD-10-CM

## 2016-01-07 DIAGNOSIS — F411 Generalized anxiety disorder: Secondary | ICD-10-CM | POA: Diagnosis not present

## 2016-01-07 DIAGNOSIS — G2581 Restless legs syndrome: Secondary | ICD-10-CM | POA: Insufficient documentation

## 2016-01-07 DIAGNOSIS — F1020 Alcohol dependence, uncomplicated: Secondary | ICD-10-CM

## 2016-01-07 DIAGNOSIS — IMO0001 Reserved for inherently not codable concepts without codable children: Secondary | ICD-10-CM

## 2016-01-07 NOTE — Assessment & Plan Note (Signed)
Restless legs in evening. Did not review full symptomatology Declines iron. Could consider ferritin Going to try yoga in AM Caffeine only twice a week Does not want gabapentin Hopes anxiety reduction may help

## 2016-01-07 NOTE — Assessment & Plan Note (Signed)
S:Trial wellbutrin 150mg  XL with phq9 down to 11 from 22 two weeks ago. GAD 7 down to 11 from 13. Denies increased anxiety. Denies SI/HI.   Provides some context for depression. States seemed to start with traumatic events that follow: Amber Barajas next Friday Ptsd. Quit job in [place]- only 2 people in company in that office (rest in [place] so husband had to work alone which increased marital stress). Very stressful- lots of travel, 60 hours a week. Thyroid and other issues started after that. Training for half marathon and a lot of physical strain. Felt burnt out on both ends. Restarted for work in [place] with same company, then father had cancer, fired and asked if she could have time off (but had already had too much medical illness)- lost job again. Husband still working at same company  A/P: continue wellbutrin xl 150mg . 4-6 week follow up. Consider 300mg  but hesitant given some anxiety. Also repeat BP at follow up -diastolic wa shigh today

## 2016-01-07 NOTE — Patient Instructions (Signed)
Let's continue wellbutrin 150mg  XL  Follow up 4-6 weeks for repeat evaluation. Your numbers are improving in regards to depression and anxiety but i would like to see further improvement. Would consider titrating dose up if not at goal by follow up.   Keep an eye on blood pressure

## 2016-01-07 NOTE — Progress Notes (Signed)
Garret Reddish, MD  Subjective:  Amber Barajas is a 45 y.o. year old very pleasant female patient who presents for/with See problem oriented charting ROS- no fever or chills. No crying spells since last visit. Has not had vomiting episodes (see prior note)  Past Medical History-  Patient Active Problem List   Diagnosis Date Noted  . Major depression ([place]) 12/23/2015    Priority: High  . Alcoholism /alcohol abuse ([place]) 11/20/2014    Priority: High  . Hypothyroidism 08/07/2015    Priority: Medium  . Chronic fatigue 08/07/2015    Priority: Medium  . GAD (generalized anxiety disorder) 12/23/2015    Priority: Low  . Osteoarthritis of right knee 12/20/2013    Priority: Low  . Restless legs 01/07/2016    Medications- reviewed and updated Current Outpatient Prescriptions  Medication Sig Dispense Refill  . Ascorbic Acid (VITAMIN C) 1000 MG tablet Take 2,000 mg by mouth 2 (two) times daily.    Marland Kitchen buPROPion (WELLBUTRIN XL) 150 MG 24 hr tablet Take 1 tablet (150 mg total) by mouth daily. 30 tablet 5  . Calcium-Vitamin A-Vitamin D (LIQUID CALCIUM PO) Take by mouth.    . cholecalciferol (VITAMIN D) 1000 UNITS tablet Take 1,000 Units by mouth daily. Takes 5000u    . Flaxseed, Linseed, (FLAXSEED OIL) 1200 MG CAPS Take by mouth.    . L-Lysine 500 MG CAPS Take 1 capsule by mouth 2 (two) times daily.     . Magnesium 200 MG TABS Take 1 tablet by mouth at bedtime.    Marland Kitchen MILK THISTLE PO Take 1 capsule by mouth daily.    Marland Kitchen NATURE-THROID 32.5 MG tablet Take 1.25 grain in am and 0.5 grain in pm as instructed. 135 tablet 1  . OLIVE LEAF EXTRACT PO Take by mouth.    . Probiotic Product (PROBIOTIC ADVANCED) CAPS Take by mouth. 25 billion daily    . Selenium 200 MCG CAPS Take 1 capsule by mouth daily.    . vitamin B-12 (CYANOCOBALAMIN) 1000 MCG tablet Take 1,000 mcg by mouth daily.    Marland Kitchen zinc gluconate 50 MG tablet Take 50 mg by mouth daily.    . clonazePAM (KLONOPIN) 0.5 MG tablet Take 0.5 mg by  mouth 2 (two) times daily as needed for anxiety. Reported on 01/07/2016     No current facility-administered medications for this visit.    Objective: BP 128/96 mmHg  Pulse 98  Temp(Src) 99 F (37.2 C)  Wt 175 lb (79.379 kg) Gen: NAD, resting comfortably Anxious appering  Assessment/Plan:  Major depression (HCC) S:Trial wellbutrin 150mg  XL with phq9 down to 11 from 22 two weeks ago. GAD 7 down to 11 from 13. Denies increased anxiety. Denies SI/HI.   Provides some context for depression. States seemed to start with traumatic events that follow: Rolanda Lundborg next Friday Ptsd. Quit job in [place]- only 2 people in company in that office (rest in [place] so husband had to work alone which increased marital stress). Very stressful- lots of travel, 60 hours a week. Thyroid and other issues started after that. Training for half marathon and a lot of physical strain. Felt burnt out on both ends. Restarted for work in [place] with same company, then father had cancer, fired and asked if she could have time off (but had already had too much medical illness)- lost job again. Husband still working at same company  A/P: continue wellbutrin xl 150mg . 4-6 week follow up. Consider 300mg  but hesitant given some anxiety. Also repeat  BP at follow up -diastolic wa shigh today   Alcoholism /alcohol abuse ([place]) Continues to decline AA- doesn't like environment, hearing same people over and over, focus on God from many members- although believes in higher power.   Still at 3-4 per day. She is still spending time with 2 female friends that do go to [place] and has lunch sessions and these tend to really help her.   Encouraged AA and cessation- not ready at present  GAD (generalized anxiety disorder) See depression tab  Restless legs Restless legs in evening. Did not review full symptomatology Declines iron. Could consider ferritin Going to try yoga in AM Caffeine only twice a week Does not want gabapentin Hopes  anxiety reduction may help  4-6 weeks. Return precautions advised.   The duration of face-to-face time during this visit was 25 minutes. Greater than 50% of this time was spent in counseling, explanation of diagnosis, planning of further management, and/or coordination of care.

## 2016-01-07 NOTE — Assessment & Plan Note (Signed)
Continues to decline AA- doesn't like environment, hearing same people over and over, focus on God from many members- although believes in higher power.   Still at 3-4 per day. She is still spending time with 2 female friends that do go to [place] and has lunch sessions and these tend to really help her.   Encouraged AA and cessation- not ready at present

## 2016-01-07 NOTE — Assessment & Plan Note (Signed)
See depression tab

## 2016-02-05 ENCOUNTER — Ambulatory Visit (INDEPENDENT_AMBULATORY_CARE_PROVIDER_SITE_OTHER): Payer: 59 | Admitting: Family Medicine

## 2016-02-05 ENCOUNTER — Encounter: Payer: Self-pay | Admitting: Family Medicine

## 2016-02-05 VITALS — BP 130/90 | HR 96 | Temp 99.0°F | Wt 175.0 lb

## 2016-02-05 DIAGNOSIS — F1020 Alcohol dependence, uncomplicated: Secondary | ICD-10-CM | POA: Diagnosis not present

## 2016-02-05 DIAGNOSIS — F322 Major depressive disorder, single episode, severe without psychotic features: Secondary | ICD-10-CM

## 2016-02-05 DIAGNOSIS — F411 Generalized anxiety disorder: Secondary | ICD-10-CM | POA: Diagnosis not present

## 2016-02-05 DIAGNOSIS — IMO0001 Reserved for inherently not codable concepts without codable children: Secondary | ICD-10-CM

## 2016-02-05 MED ORDER — BUPROPION HCL ER (XL) 300 MG PO TB24: 300.0000 mg | ORAL_TABLET | Freq: Every day | ORAL | Status: DC

## 2016-02-05 NOTE — Patient Instructions (Signed)
I am glad you are improving. We are going to work to accelerate the continued positive changes by increasing wellbutrin to 300mg  XL. Let's check back in 6 weeks. Continue meeting with [facility] and your yoga- these are excellent changes. Congratulations on school going well.   Obvious if you have thoughts of hurting yourself call us immediately or call 911.

## 2016-02-05 NOTE — Assessment & Plan Note (Signed)
S: PHQ 9 22--> 11--> 10. Depression largely stable from last visit despite 6 weeks of wellbutrin 150mg  XL GAD7 12--> 11 --> 6. Further improvement in anxiety Alcohol 3-4 per day is stable but has read 2 books and is motivated to try to cut back Herbie [place] merlin every 2 weeks hypnotherapy has been helpful. Doing yoga one day a week.  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 She is overall improved Has had thoughts that she would be better off dead but no suicidal thoughts  And frequency is sparse and improving. Able to contract for safety if SI developed.  A/P: Not quite to goal- will titrate to 300mg  wellbutrin XL with 6 week follow up. We considered making no change but hopeful more rapid improvement will help her move toward alcohol cessation goals quicker.

## 2016-02-05 NOTE — Progress Notes (Signed)
Subjective:  Amber Barajas is a 45 y.o. year old very pleasant female patient who presents for/with See problem oriented charting ROS- no HI. No chest pain or shortness of breath. No headache or blurry vision.   Past Medical History-  Patient Active Problem List   Diagnosis Date Noted  . Major depression ([place]) 12/23/2015    Priority: High  . Alcoholism /alcohol abuse ([place]) 11/20/2014    Priority: High  . Hypothyroidism 08/07/2015    Priority: Medium  . Chronic fatigue 08/07/2015    Priority: Medium  . GAD (generalized anxiety disorder) 12/23/2015    Priority: Low  . Osteoarthritis of right knee 12/20/2013    Priority: Low  . Restless legs 01/07/2016    Medications- reviewed and updated Current Outpatient Prescriptions  Medication Sig Dispense Refill  . Ascorbic Acid (VITAMIN C) 1000 MG tablet Take 2,000 mg by mouth 2 (two) times daily.    Marland Kitchen buPROPion (WELLBUTRIN XL) 150 MG 24 hr tablet Take 1 tablet (150 mg total) by mouth daily. 30 tablet 5  . Calcium-Vitamin A-Vitamin D (LIQUID CALCIUM PO) Take by mouth.    . cholecalciferol (VITAMIN D) 1000 UNITS tablet Take 1,000 Units by mouth daily. Takes 5000u    . Flaxseed, Linseed, (FLAXSEED OIL) 1200 MG CAPS Take by mouth.    . L-Lysine 500 MG CAPS Take 1 capsule by mouth 2 (two) times daily.     . Magnesium 200 MG TABS Take 3 tablets by mouth at bedtime.     Marland Kitchen MILK THISTLE PO Take 1 capsule by mouth daily.    Marland Kitchen NATURE-THROID 32.5 MG tablet Take 1.25 grain in am and 0.5 grain in pm as instructed. 135 tablet 1  . OLIVE LEAF EXTRACT PO Take by mouth.    . Probiotic Product (PROBIOTIC ADVANCED) CAPS Take by mouth. 25 billion daily    . Selenium 200 MCG CAPS Take 1 capsule by mouth daily.    . vitamin B-12 (CYANOCOBALAMIN) 1000 MCG tablet Take 1,000 mcg by mouth daily.    Marland Kitchen zinc gluconate 50 MG tablet Take 50 mg by mouth daily.    . clonazePAM (KLONOPIN) 0.5 MG tablet Take 0.5 mg by mouth 2 (two) times daily as needed for  anxiety. Reported on 02/05/2016     Objective: BP 130/90 mmHg  Pulse 96  Temp(Src) 99 F (37.2 C)  Wt 175 lb (79.379 kg) Gen: NAD, resting comfortably CV: RRR no murmurs rubs or gallops Lungs: CTAB no crackles, wheeze, rhonchi Abdomen: soft/nontender/nondistended/normal bowel sounds. No rebound or guarding.  Ext: no edema Skin: warm, dry Neuro: grossly normal, moves all extremities  Assessment/Plan:  Alcoholism /alcohol abuse (HCC) GAD (generalized anxiety disorder) Major depression (HCC) S: PHQ 9 22--> 11--> 10. Depression largely stable from last visit despite 6 weeks of wellbutrin 150mg  XL GAD7 12--> 11 --> 6. Further improvement in anxiety Alcohol 3-4 per day is stable but has read 2 books and is motivated to try to cut back Herbie [place] merlin every 2 weeks hypnotherapy has been helpful. Doing yoga one day a week.  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 She is overall improved Has had thoughts that she would be better off dead but no suicidal thoughts  And frequency is sparse and improving. Able to contract for safety if SI developed.  A/P: Not quite to goal- will titrate to 300mg  wellbutrin  XL with 6 week follow up. We considered making no change but hopeful more rapid improvement will help her move toward alcohol cessation goals quicker.    Watch BP-  Discussed if remains up at follow up consider DASH diet. Moving parts with above issues though so hold off on adding one more issue to plate.  BP Readings from Last 3 Encounters:  02/05/16 130/90  01/07/16 128/96  12/23/15 140/82   6 weeks but sooner Return precautions advised.   Meds ordered this encounter  Medications  . buPROPion (WELLBUTRIN XL) 300 MG 24 hr tablet    Sig: Take 1 tablet (300 mg total) by mouth daily.    Dispense:  30 tablet    Refill:  5   The duration of  face-to-face time during this visit was 25 minutes. Greater than 50% of this time was spent in counseling, explanation of diagnosis, planning of further management, and/or coordination of care.   Garret Reddish, MD

## 2016-02-16 ENCOUNTER — Ambulatory Visit (INDEPENDENT_AMBULATORY_CARE_PROVIDER_SITE_OTHER): Payer: 59 | Admitting: Sports Medicine

## 2016-02-16 ENCOUNTER — Encounter: Payer: Self-pay | Admitting: Sports Medicine

## 2016-02-16 ENCOUNTER — Telehealth: Payer: Self-pay | Admitting: Sports Medicine

## 2016-02-16 DIAGNOSIS — M1711 Unilateral primary osteoarthritis, right knee: Secondary | ICD-10-CM

## 2016-02-16 MED ORDER — HYALURONAN 30 MG/2ML IX SOSY: 1.0000 | PREFILLED_SYRINGE | INTRA_ARTICULAR | Status: DC

## 2016-02-16 MED ORDER — MELOXICAM 15 MG PO TABS: ORAL_TABLET | ORAL | Status: DC

## 2016-02-16 NOTE — Telephone Encounter (Signed)
Received the following from OV benefits investigation:  Patient has [employer] Plus POS plan with an effective date of 10/12/2015. Doctor may not buy and bill, medication must be obtained through the specialty pharmacy. Please contact the Orthovisc line if you would like medication transferred to specialty pharmacy. Medical notes must be submitted with the claim. Include medical necessity, diagnosis, and all clinical. [id number] is covered at 80% of the contracted rate when performed in an office setting. *Deductible has been met. If out of pocket is met, coverage goes to 100%. Reference: [id number]   Also, received enrollment form for [place]. Form completed and faxed in.

## 2016-02-16 NOTE — Progress Notes (Signed)
  Subjective:    CC: Right knee pain  HPI: This is a pleasant 45 year old female, she has known right knee osteoarthritis and responded extremely well to Euflexxa to be a half years ago. She is starting to have a recurrence of pain and desires repeat Visco supplementation, pain is mild, worsening, localized at the medial joint line with gelling.  Past medical history, Surgical history, Family history not pertinant except as noted below, Social history, Allergies, and medications have been entered into the medical record, reviewed, and no changes needed.   Review of Systems: No fevers, chills, night sweats, weight loss, chest pain, or shortness of breath.   Objective:    General: Well Developed, well nourished, and in no acute distress.  Neuro: Alert and oriented x3, extra-ocular muscles intact, sensation grossly intact.  HEENT: Normocephalic, atraumatic, pupils equal round reactive to light, neck supple, no masses, no lymphadenopathy, thyroid nonpalpable.  Skin: Warm and dry, no rashes. Cardiac: Regular rate and rhythm, no murmurs rubs or gallops, no lower extremity edema.  Respiratory: Clear to auscultation bilaterally. Not using accessory muscles, speaking in full sentences. Right Knee: Normal to inspection with no erythema or effusion or obvious bony abnormalities. Tender to palpation along the medial joint line ROM normal in flexion and extension and lower leg rotation. Ligaments with solid consistent endpoints including ACL, PCL, LCL, MCL. Negative Mcmurray's and provocative meniscal tests. Non painful patellar compression. Patellar and quadriceps tendons unremarkable. Hamstring and quadriceps strength is normal.  Impression and Recommendations:    I spent 25 minutes with this patient, greater than 50% was face-to-face time counseling regarding the above diagnoses

## 2016-02-16 NOTE — Assessment & Plan Note (Signed)
Work on [employer], she will return to start the injections,restarting meloxicam.

## 2016-02-16 NOTE — Telephone Encounter (Addendum)
OrthoVisc sent to [place].

## 2016-02-16 NOTE — Addendum Note (Signed)
Addended by: Silverio Decamp on: 02/16/2016 04:52 PM   Modules accepted: Orders

## 2016-02-16 NOTE — Telephone Encounter (Signed)
-----   Message from Silverio Decamp, MD sent at 02/16/2016 11:34 AM EDT ----- Right knee osteoarthritis, good response to years ago to Euflexxa, approval for Orthovisc please, x-ray confirmed.  Preesh Boogs, ___________________________________________ Gwen Her. Dianah Field, M.D., ABFM., CAQSM. Primary Care and [place] [employer] of [employer] [facility]

## 2016-02-16 NOTE — Telephone Encounter (Signed)
Submitted for approval on Orthovisc. Awaiting confirmation.  

## 2016-03-11 NOTE — Telephone Encounter (Signed)
Pt called clinic to check status of OV injections. Ethel, they advised they have tried to contact the Pt multiple times unsuccessfully. Advised me to have Pt call them back [phone]) to verify information then Rx will be shipped.  Called Pt, gave phone number and instruction to call pharmacy. verbalized understanding.

## 2016-03-18 ENCOUNTER — Encounter: Payer: Self-pay | Admitting: Sports Medicine

## 2016-03-18 ENCOUNTER — Ambulatory Visit (INDEPENDENT_AMBULATORY_CARE_PROVIDER_SITE_OTHER): Payer: 59 | Admitting: Family Medicine

## 2016-03-18 ENCOUNTER — Ambulatory Visit (INDEPENDENT_AMBULATORY_CARE_PROVIDER_SITE_OTHER): Payer: 59 | Admitting: Sports Medicine

## 2016-03-18 ENCOUNTER — Encounter: Payer: Self-pay | Admitting: Family Medicine

## 2016-03-18 VITALS — BP 130/84 | HR 84 | Resp 16 | Wt 174.0 lb

## 2016-03-18 VITALS — BP 124/82 | HR 86 | Temp 98.0°F | Ht 67.0 in | Wt 172.0 lb

## 2016-03-18 DIAGNOSIS — F1020 Alcohol dependence, uncomplicated: Secondary | ICD-10-CM | POA: Diagnosis not present

## 2016-03-18 DIAGNOSIS — M1711 Unilateral primary osteoarthritis, right knee: Secondary | ICD-10-CM

## 2016-03-18 DIAGNOSIS — F411 Generalized anxiety disorder: Secondary | ICD-10-CM

## 2016-03-18 DIAGNOSIS — F322 Major depressive disorder, single episode, severe without psychotic features: Secondary | ICD-10-CM | POA: Diagnosis not present

## 2016-03-18 DIAGNOSIS — IMO0001 Reserved for inherently not codable concepts without codable children: Secondary | ICD-10-CM

## 2016-03-18 NOTE — Progress Notes (Signed)
Pre visit review using our clinic review tool, if applicable. No additional management support is needed unless otherwise documented below in the visit note. 

## 2016-03-18 NOTE — Progress Notes (Signed)

## 2016-03-18 NOTE — Patient Instructions (Signed)
Things look great. Much improved. Let's continue wellbutrin 300mg  XL. Follow up in 6 months sooner if new or worsening symptoms.   Great job cutting down alcohol- if you need further support with this let me know.

## 2016-03-18 NOTE — Progress Notes (Signed)
Subjective:  Amber Barajas is a 45 y.o. year old very pleasant female patient who presents for/with See problem oriented charting ROS- No SI/HI. No nausea/vomiting/abdominal pain.see any ROS included in HPI as well.   Past Medical History-  Patient Active Problem List   Diagnosis Date Noted  . Major depression ([place]) 12/23/2015    Priority: High  . Alcoholism /alcohol abuse ([place]) 11/20/2014    Priority: High  . Hypothyroidism 08/07/2015    Priority: Medium  . Chronic fatigue 08/07/2015    Priority: Medium  . GAD (generalized anxiety disorder) 12/23/2015    Priority: Low  . Osteoarthritis of right knee 12/20/2013    Priority: Low  . Restless legs 01/07/2016    Medications- reviewed and updated Current Outpatient Prescriptions  Medication Sig Dispense Refill  . buPROPion (WELLBUTRIN XL) 300 MG 24 hr tablet Take 1 tablet (300 mg total) by mouth daily. 30 tablet 5  . Calcium-Vitamin A-Vitamin D (LIQUID CALCIUM PO) Take by mouth.    . cholecalciferol (VITAMIN D) 1000 UNITS tablet Take 1,000 Units by mouth daily. Takes 5000u    . clonazePAM (KLONOPIN) 0.5 MG tablet Take 0.5 mg by mouth 2 (two) times daily as needed for anxiety. Reported on 02/05/2016    . Flaxseed, Linseed, (FLAXSEED OIL) 1200 MG CAPS Take by mouth.    . Hyaluronan (ORTHOVISC) 30 MG/2ML SOSY Inject 1 Syringe into the articular space once a week. 4 Syringe 0  . Magnesium 200 MG TABS Take 3 tablets by mouth at bedtime.     . meloxicam (MOBIC) 15 MG tablet One tab PO qAM with breakfast for 2 weeks, then daily prn pain. 30 tablet 3  . MILK THISTLE PO Take 1 capsule by mouth daily.    Marland Kitchen NATURE-THROID 32.5 MG tablet Take 1.25 grain in am and 0.5 grain in pm as instructed. 135 tablet 1  . OLIVE LEAF EXTRACT PO Take by mouth.    . Probiotic Product (PROBIOTIC ADVANCED) CAPS Take by mouth. 25 billion daily    . vitamin B-12 (CYANOCOBALAMIN) 1000 MCG tablet Take 1,000 mcg by mouth daily.     No current  facility-administered medications for this visit.    Objective: BP 124/82 mmHg  Pulse 86  Temp(Src) 98 F (36.7 C) (Oral)  Ht 5\' 7"  (1.702 m)  Wt 172 lb (78.019 kg)  BMI 26.93 kg/m2  SpO2 97% Gen: NAD, resting comfortably CV: RRR no murmurs rubs or gallops Lungs: CTAB no crackles, wheeze, rhonchi Abdomen: soft/nontender/nondistended/normal bowel sounds.  Ext: no edema Skin: warm, dry Psych: non depressed mood  Assessment/Plan:  Major depression (HCC) Alcohol abuse GAD S: Last visit we increased wellbutrin up to 300mg  XL.  PHQ9 22-->11--> 10--> 4. Much improved depression.  GAD7 12-->11-->6-->3. Much improved anxiety.  Alcohol 3-4 per day. 2 in week, 2-3 on weekend. Set a goal to quit next week potentially.  Started--> Walking more. Weight watchers online.  Merlin counseling every 2 weeks- great fit for her No SI/HI.  A/P: IMproved in every measure. Continue current therapy. High self motivation. Wants to consider starting back working but lower stress job. Blood pressure also improved with lower anxiety. May quit drinking- we discussed short term use of klonopin with transition.     Return in about 6 months (around 09/17/2016) for follow up- or sooner if needed. Return precautions advised.   The duration of face-to-face time during this visit was 25 minutes. Greater than 50% of this time was spent in counseling, explanation of diagnosis,  planning of further management, and/or coordination of care.   Garret Reddish, MD

## 2016-03-18 NOTE — Assessment & Plan Note (Signed)
Alcohol abuse GAD S: Last visit we increased wellbutrin up to 300mg  XL.  PHQ9 22-->11--> 10--> 4. Much improved depression.  GAD7 12-->11-->6-->3. Much improved anxiety.  Alcohol 3-4 per day. 2 in week, 2-3 on weekend. Set a goal to quit next week potentially.  Started--> Walking more. Weight watchers online.  Merlin counseling every 2 weeks- great fit for her No SI/HI.  A/P: IMproved in every measure. Continue current therapy. High self motivation. Wants to consider starting back working but lower stress job. Blood pressure also improved with lower anxiety. May quit drinking- we discussed short term use of klonopin with transition.

## 2016-03-18 NOTE — Assessment & Plan Note (Signed)
2 year response to previous Euflexxa series, starting Orthovisc today. Return in one week for injection #2 of 4

## 2016-03-25 ENCOUNTER — Ambulatory Visit (INDEPENDENT_AMBULATORY_CARE_PROVIDER_SITE_OTHER): Payer: 59 | Admitting: Sports Medicine

## 2016-03-25 VITALS — BP 130/85 | HR 90 | Ht 67.0 in | Wt 167.0 lb

## 2016-03-25 DIAGNOSIS — M1711 Unilateral primary osteoarthritis, right knee: Secondary | ICD-10-CM

## 2016-03-25 NOTE — Assessment & Plan Note (Signed)
Orthovisc injection #2 of 4 into the right knee, return in one week for #3 of 4. 2 year response to prior series of Orthovisc

## 2016-03-25 NOTE — Progress Notes (Signed)

## 2016-04-01 ENCOUNTER — Encounter: Payer: Self-pay | Admitting: Sports Medicine

## 2016-04-01 ENCOUNTER — Ambulatory Visit (INDEPENDENT_AMBULATORY_CARE_PROVIDER_SITE_OTHER): Payer: 59 | Admitting: Sports Medicine

## 2016-04-01 VITALS — BP 130/84 | HR 87 | Resp 18 | Wt 168.0 lb

## 2016-04-01 DIAGNOSIS — M1711 Unilateral primary osteoarthritis, right knee: Secondary | ICD-10-CM | POA: Diagnosis not present

## 2016-04-01 NOTE — Progress Notes (Signed)

## 2016-04-01 NOTE — Assessment & Plan Note (Signed)
Orthovisc injection #3 of 4 into the right knee, starting to do better. Return in one week for #4

## 2016-04-08 ENCOUNTER — Encounter: Payer: Self-pay | Admitting: Sports Medicine

## 2016-04-08 ENCOUNTER — Ambulatory Visit (INDEPENDENT_AMBULATORY_CARE_PROVIDER_SITE_OTHER): Payer: 59 | Admitting: Sports Medicine

## 2016-04-08 DIAGNOSIS — M1711 Unilateral primary osteoarthritis, right knee: Secondary | ICD-10-CM | POA: Diagnosis not present

## 2016-04-08 MED ORDER — CELECOXIB 200 MG PO CAPS: ORAL_CAPSULE | ORAL | Status: AC

## 2016-04-08 MED ORDER — TRAMADOL HCL 50 MG PO TABS: ORAL_TABLET | ORAL | Status: AC

## 2016-04-08 NOTE — Progress Notes (Signed)

## 2016-04-08 NOTE — Assessment & Plan Note (Signed)
Fantastic improvements however still having some pain. Orthovisc injection #4 of 4 given today into the right knee. Switching from meloxicam to Celebrex, adding tramadol for breakthrough pain.

## 2016-05-06 ENCOUNTER — Ambulatory Visit (INDEPENDENT_AMBULATORY_CARE_PROVIDER_SITE_OTHER): Payer: 59 | Admitting: Internal Medicine

## 2016-05-06 ENCOUNTER — Encounter: Payer: Self-pay | Admitting: Internal Medicine

## 2016-05-06 ENCOUNTER — Ambulatory Visit (INDEPENDENT_AMBULATORY_CARE_PROVIDER_SITE_OTHER): Payer: 59 | Admitting: Sports Medicine

## 2016-05-06 ENCOUNTER — Encounter: Payer: Self-pay | Admitting: Sports Medicine

## 2016-05-06 ENCOUNTER — Other Ambulatory Visit: Payer: Self-pay | Admitting: Internal Medicine

## 2016-05-06 VITALS — BP 120/88 | HR 91 | Wt 167.4 lb

## 2016-05-06 DIAGNOSIS — M1711 Unilateral primary osteoarthritis, right knee: Secondary | ICD-10-CM

## 2016-05-06 DIAGNOSIS — E039 Hypothyroidism, unspecified: Secondary | ICD-10-CM

## 2016-05-06 LAB — TSH: TSH: 1.11 u[IU]/mL (ref 0.35–4.50)

## 2016-05-06 LAB — T4, FREE: FREE T4: 0.65 ng/dL (ref 0.60–1.60)

## 2016-05-06 LAB — T3, FREE: T3, Free: 3.1 pg/mL (ref 2.3–4.2)

## 2016-05-06 MED ORDER — NATURE-THROID 32.5 MG PO TABS: ORAL_TABLET | ORAL | 3 refills | Status: DC

## 2016-05-06 NOTE — Progress Notes (Signed)
  Subjective:    CC: Follow-up  HPI: We finished a series of Orthovisc, unfortunately did not have his good response as she did earlier with Euflexxa. Pain is moderate, persistent. Continues with Celebrex which takes her pain down to 0.  Past medical history, Surgical history, Family history not pertinant except as noted below, Social history, Allergies, and medications have been entered into the medical record, reviewed, and no changes needed.   Review of Systems: No fevers, chills, night sweats, weight loss, chest pain, or shortness of breath.   Objective:    General: Well Developed, well nourished, and in no acute distress.  Neuro: Alert and oriented x3, extra-ocular muscles intact, sensation grossly intact.  HEENT: Normocephalic, atraumatic, pupils equal round reactive to light, neck supple, no masses, no lymphadenopathy, thyroid nonpalpable.  Skin: Warm and dry, no rashes. Cardiac: Regular rate and rhythm, no murmurs rubs or gallops, no lower extremity edema.  Respiratory: Clear to auscultation bilaterally. Not using accessory muscles, speaking in full sentences.   Impression and Recommendations:    Osteoarthritis of right knee Had a better response initially with Euflexxa, Orthovisc has only provided modest relief, Celebrex keeps her pain essentially gone.  We did discuss supplements such as devils claw, Glucosamine and Chondroitin (Cosamin), as well as Turmeric. If we have to do another series of viscous supplementation I would recommend we go with Euflexxa, and list Orthovisc as a failure.  I spent 25 minutes with this patient, greater than 50% was face-to-face time counseling regarding the above diagnoses

## 2016-05-06 NOTE — Patient Instructions (Addendum)
Please continue current NT dose.  Take the thyroid hormone every day, with water, at least 30 minutes before breakfast, separated by at least 4 hours from: - acid reflux medications - calcium - iron - multivitamins  Please stop at the lab.  Please return in 1 year.

## 2016-05-06 NOTE — Patient Instructions (Signed)
supplements such as devils claw, Glucosamine and Chondroitin (Cosamin), as well as Turmeric.

## 2016-05-06 NOTE — Assessment & Plan Note (Signed)
Had a better response initially with Euflexxa, Orthovisc has only provided modest relief, Celebrex keeps her pain essentially gone.  We did discuss supplements such as devils claw, Glucosamine and Chondroitin (Cosamin), as well as Turmeric. If we have to do another series of viscous supplementation I would recommend we go with Euflexxa, and list Orthovisc as a failure.

## 2016-05-06 NOTE — Progress Notes (Signed)
Patient ID: Amber Barajas, female   DOB: 1971/09/10, 45 y.o.   MRN: [id number]   HPI  Amber Barajas is a 45 y.o.-year-old female, returning for follow-up for hypothyroidism. Last visit 6 months ago.  Reviewed hx: She has been diagnosed with hypothyroidism in 2012. She started to have sxs in 2008. She had weight gain, heavy periods (had endom. Ablation - now normal periods). She then saw an endocrinologist >> Rx'ed Synthroid. She did not feel good.  Patient has been seen at [facility], and she has been on Naturethroid 1.25 gr twice a day (equivalent levothyroxine dose of 250 g daily) at last visit on 07/08/2015. She was also on Lugol's iodine solution 2 drops daily (stopped). Of note, patient was on lithium orotate daily at bedtime - for improved sleep and relaxation (stopped).  [facility], however, TSH levels have not been checked, her thyroid hormone dose adjustments were made based on free T3 and free T4 (!)  Latest levels reviewed- normal at last visit: Component     Latest Ref Rng & Units 08/07/2015 09/18/2015 11/07/2015  Triiodothyronine,Free,Serum     2.3 - 4.2 pg/mL 4.7 (H) 3.7 3.3  T4,Free(Direct)     0.60 - 1.60 ng/dL 0.69 0.59 (L) 0.69  TSH     0.35 - 4.50 uIU/mL 0.09 (L) 0.64 0.91   [facility]: Labs on 06/20/2015:   free T3 3.1 (2-4.4), free T4 0.77 (0.82-1.77), TPO antibodies 14 (0-34), ATA <1.0 (0-0.9) Previous labs on 02/14/2015:  Free T3 3.1 (2-4.4), free T4 0.76 (0.82-1.77)  As her TSH was very suppressed in 07/2015 >> we decreased her dose of Naturethyroid to 1.25 gr in a.m. and 0.5 gr in p.m. (TDD equivalent of 175 mcg LT4 daily). F/u TFTs were normal.  She takes the thyroid hormones: - First dose fasting and second dose at 3:30-4 pm - with water - separated by >30 min from b'fast  - no calcium, iron, PPIs, multivitamins  (+ Biotin in MVI - did not take  it in 2 days).   Pt describes: - no weight loss/gain - + hair loss - no fatigue (she was diagnosed with chronic fatigue syndrome >> now much better) - no heat intolerance - no  palpitations - + anxiety - no diarrhea/ no constipation - no dry skin  Pt denies feeling nodules in neck, hoarseness, dysphagia/odynophagia, SOB with lying down.  She has no FH of thyroid disorders. No FH of thyroid cancer.  No h/o radiation tx to head or neck.  She also has a h/o of: Anxiety (GAD), alcoholism - now with better control of cravings. 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 She is on methylated vitamins. She is on multiple other supplements.  She is now on an antidepressants >> feels better. Also on [place]anda, [place], [place]. On Probiotics and pancreatic enzyme.  She stopped: testosterone, pregnenolone, DHEA, and we have a new hormonal evaluation with her naturopath.  ROS: Constitutional: + see HPI Eyes: no blurry vision, no xerophthalmia ENT: no sore throat, no nodules palpated in throat, no dysphagia/no odynophagia, no hoarseness Cardiovascular: no CP/SOB/palpitations/no leg swelling Respiratory: no cough/SOB Gastrointestinal: + N/no V/+D/no C Musculoskeletal: no muscle/joint aches Skin: no rashes, no easy bruising Neurological: no tremors/numbness/tingling/dizziness, no lightheadedness/vertigo  I reviewed pt's medications, allergies, PMH, social hx, family hx, and changes were documented in the history of present illness. Otherwise, unchanged from my initial visit note.  Past Medical History:  Diagnosis Date  . Alcoholism /alcohol abuse ([place]) 11/20/14   3-4 per day at present  . Anxiety   . Chronic fatigue syndrome 07/08/15  . Depression 12/22/12  . 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 Perimenopausal   . Thyroid disease   . Tick bite 07/08/15   Past Surgical History:  Procedure Laterality  Date  . BREAST LUMPECTOMY Right 08/01/2013   benign  . BREAST LUMPECTOMY WITH RADIOACTIVE SEED LOCALIZATION Left 10/23/2015   benign  . CHOLECYSTECTOMY  2011   lap choli  . ESSURE TUBAL LIGATION  2004  . FINGER SURGERY  2014   dupuytren's contracture  . [place]  2009  . TONSILLECTOMY  1977  . WISDOM TOOTH EXTRACTION  2003   Social History   Social History  . Marital Status: Married    Spouse Name: N/A  . Number of Children: 0   Occupational History  . student   Social History Main Topics  . Smoking status: Never Smoker   . Smokeless tobacco: Never Used  . Alcohol Use: No  . Drug Use: No   Current Outpatient Prescriptions on File Prior to Visit  Medication Sig Dispense Refill  . buPROPion (WELLBUTRIN XL) 300 MG 24 hr tablet Take 1 tablet (300 mg total) by mouth daily. 30 tablet 5  . Calcium-Vitamin A-Vitamin D (LIQUID CALCIUM PO) Take by mouth.    . celecoxib (CELEBREX) 200 MG capsule One to 2 tablets by mouth daily as needed for pain. 60 capsule 2  . cholecalciferol (VITAMIN D) 1000 UNITS tablet Take 1,000 Units by mouth daily. Takes 5000u    . clonazePAM (KLONOPIN) 0.5 MG tablet Take 0.5 mg by mouth 2 (two) times daily as needed for anxiety. Reported on 02/05/2016    . Flaxseed, Linseed, (FLAXSEED OIL) 1200 MG CAPS Take by mouth.    . Hyaluronan (ORTHOVISC) 30 MG/2ML SOSY Inject 1 Syringe into the articular space once a week. 4 Syringe 0  . Magnesium 200 MG TABS Take 3 tablets by mouth at bedtime.     Marland Kitchen MILK THISTLE PO Take 1 capsule by mouth daily.    Marland Kitchen NATURE-THROID 32.5 MG tablet Take 1.25 grain in am and 0.5 grain in pm as instructed. 135 tablet 1  . OLIVE LEAF EXTRACT PO Take by mouth.    . Probiotic Product (PROBIOTIC ADVANCED) CAPS Take by mouth. 25 billion daily    . traMADol (ULTRAM) 50 MG tablet 1-2 tabs by mouth Q8 hours, maximum 6 tabs per day. 40 tablet 0  . vitamin B-12 (CYANOCOBALAMIN) 1000 MCG tablet Take 1,000 mcg by mouth daily.     No current  facility-administered medications on file prior to visit.    Allergies  Allergen Reactions  . Penicillins Hives and Itching  . Tape    Family History  Problem Relation Age of Onset  . Lung cancer Father 76    smoker  . Cancer Paternal Uncle     brain  . Depression Mother   . Alcoholism Sister   . Depression Sister    PE: BP 120/88   Pulse 91   Wt 167 lb 6.4 oz (75.9 kg)   SpO2 97%   BMI 26.22 kg/m  Body mass index is 26.22 kg/m.  Wt Readings from Last 3 Encounters:  05/06/16 167 lb 6.4 oz (75.9 kg)  04/08/16 172 lb (78 kg)  04/01/16 168 lb (76.2 kg)   Constitutional: overweight, in  NAD Eyes: PERRLA, EOMI, no exophthalmos ENT: moist mucous membranes, no thyromegaly, no cervical lymphadenopathy Cardiovascular: RRR, No MRG Respiratory: CTA B Gastrointestinal: abdomen soft, NT, ND, BS+ Musculoskeletal: no deformities, strength intact in all 4 Skin: moist, warm, no rashes Neurological: no tremor with outstretched hands, DTR normal in all 4  ASSESSMENT: 1. Hypothyroidism  PLAN:  1. Patient with long-standing hypothyroidism, previously on high dose desiccated thyroid extract managed by [facility]. At our first visit, she appeared restless, had tremor with outstretched hands and complained of palpitations. TSH returned very suppressed, after which her Naturethroid dose was decreased with subsequent normalization of her thyroid tests. She feels much better. - We discussed about correct intake of levothyroxine, fasting, with water, separated by at least 30 minutes from breakfast, and separated by more than 4 hours from calcium, iron, multivitamins, acid reflux medications (PPIs). She is taking it correctly. - will check thyroid tests today: TSH, free T4, free T4 - We'll continue the current dose of Naturethroid of 1.25 gr in a.m. and 0.5 gr in p.m. (TDD equivalent of 175 mcg LT4 daily). She did not take her biotin today and yesterday. - If labs are abnormal, she will  need to return in 6 weeks for repeat labs - If these are normal, I will see her back in 1 year She may move to [place] next year.  Needs refills when labs are back - 3 months.  Office Visit on 05/06/2016  Component Date Value Ref Range Status  . TSH 05/06/2016 1.11  0.35 - 4.50 uIU/mL Final  . Free T4 05/06/2016 0.65  0.60 - 1.60 ng/dL Final  . T3, Free 05/06/2016 3.1  2.3 - 4.2 pg/mL Final   Normal results >> will refill NT at same dose  Philemon Kingdom, MD PhD [facility] Endocrinology

## 2016-05-24 ENCOUNTER — Encounter: Payer: Self-pay | Admitting: Internal Medicine

## 2016-07-06 ENCOUNTER — Ambulatory Visit (INDEPENDENT_AMBULATORY_CARE_PROVIDER_SITE_OTHER): Payer: 59 | Admitting: Family Medicine

## 2016-07-06 ENCOUNTER — Encounter: Payer: Self-pay | Admitting: Family Medicine

## 2016-07-06 VITALS — BP 130/90 | HR 96 | Temp 98.3°F | Ht 66.75 in | Wt 167.6 lb

## 2016-07-06 DIAGNOSIS — F322 Major depressive disorder, single episode, severe without psychotic features: Secondary | ICD-10-CM | POA: Diagnosis not present

## 2016-07-06 MED ORDER — BUPROPION HCL ER (XL) 300 MG PO TB24: 300.0000 mg | ORAL_TABLET | Freq: Every day | ORAL | 3 refills | Status: AC

## 2016-07-06 MED ORDER — CLONAZEPAM 0.5 MG PO TABS: 0.5000 mg | ORAL_TABLET | Freq: Every day | ORAL | 0 refills | Status: DC | PRN

## 2016-07-06 NOTE — Assessment & Plan Note (Addendum)
S: controlled on wellbutrin 300mg  XL, klonopin 0.5 mg 1-2x a week PHQ9 from peak of 22 to 4 at last visit GAD7 from 12 at peak to 3 Alcohol use down to max 2 a day and does not drink everyday Has also lost 7 lbs by exercising more Interviewing for jobs in [place] which has bene very anxiety provoking- her generalized anxiety is well controlled but situational issues persist A/P: agreed to #60 klonopin for sparing use as applying for jobs/interviewing for situational anxiety. This may be our last visit together sadly as she and husband are making progress applying for jobs in [place]. Was a pleasure caring for patient and seeing her improve if this is her last visit with [place]- encouraged her to quickly reestablish care in [place] if moves.

## 2016-07-06 NOTE — Patient Instructions (Addendum)
Refilled klonopin. Really thrilled you are doing so well.

## 2016-07-06 NOTE — Progress Notes (Signed)
Pre visit review using our clinic review tool, if applicable. No additional management support is needed unless otherwise documented below in the visit note. 

## 2016-07-06 NOTE — Progress Notes (Signed)
Subjective:  Amber Barajas is a 45 y.o. year old very pleasant female patient who presents for/with See problem oriented charting ROS- no SI/HI. No chest pain or shortness of breath. Admits to anxiety situational with job hunt.see any ROS included in HPI as well.   Past Medical History-  Patient Active Problem List   Diagnosis Date Noted  . Major depression ([place]) 12/23/2015    Priority: High  . Alcoholism /alcohol abuse ([place]) 11/20/2014    Priority: High  . Hypothyroidism 08/07/2015    Priority: Medium  . Chronic fatigue 08/07/2015    Priority: Medium  . GAD (generalized anxiety disorder) 12/23/2015    Priority: Low  . Osteoarthritis of right knee 12/20/2013    Priority: Low  . Restless legs 01/07/2016    Medications- reviewed and updated Current Outpatient Prescriptions  Medication Sig Dispense Refill  . buPROPion (WELLBUTRIN XL) 300 MG 24 hr tablet Take 1 tablet (300 mg total) by mouth daily. 90 tablet 3  . Calcium-Vitamin A-Vitamin D (LIQUID CALCIUM PO) Take by mouth.    . celecoxib (CELEBREX) 200 MG capsule One to 2 tablets by mouth daily as needed for pain. 60 capsule 2  . cholecalciferol (VITAMIN D) 1000 UNITS tablet Take 1,000 Units by mouth daily. Takes 5000u    . clonazePAM (KLONOPIN) 0.5 MG tablet Take 1 tablet (0.5 mg total) by mouth daily as needed for anxiety. Reported on 02/05/2016 60 tablet 0  . Flaxseed, Linseed, (FLAXSEED OIL) 1200 MG CAPS Take by mouth.    . Linoleic Acid-Sunflower Oil [phone] MG CAPS Take 1 capsule by mouth 3 (three) times daily with meals.    . Magnesium 200 MG TABS Take 3 tablets by mouth at bedtime.     Marland Kitchen MILK THISTLE PO Take 1 capsule by mouth daily.    Marland Kitchen NATURE-THROID 32.5 MG tablet Take 1.25 grain in am and 0.5 grain in pm as instructed. 315 tablet 3  . OLIVE LEAF EXTRACT PO Take by mouth.    . Probiotic Product (PROBIOTIC ADVANCED) CAPS Take by mouth. 25 billion daily    . traMADol (ULTRAM) 50 MG tablet 1-2 tabs by mouth Q8  hours, maximum 6 tabs per day. 40 tablet 0  . vitamin B-12 (CYANOCOBALAMIN) 1000 MCG tablet Take 1,000 mcg by mouth daily.     No current facility-administered medications for this visit.     Objective: BP 130/90 (BP Location: Right Arm, Patient Position: Sitting, Cuff Size: Normal)   Pulse 96   Temp 98.3 F (36.8 C) (Oral)   Ht 5' 6.75" (1.695 m)   Wt 167 lb 9.6 oz (76 kg)   SpO2 95%   BMI 26.45 kg/m  Gen: NAD, resting comfortably CV: RRR no murmurs rubs or gallops Lungs: CTAB no crackles, wheeze, rhonchi  Assessment/Plan:  Major depression (H[facility]) S: controlled on wellbutrin 300mg  XL, klonopin 0.5 mg 1-2x a week PHQ9 from peak of 22 to 4 at last visit GAD7 from 12 at peak to 3 Alcohol use down to max 2 a day and does not drink everyday Has also lost 7 lbs by exercising more Interviewing for jobs in [place] which has bene very anxiety provoking- her generalized anxiety is well controlled but situational issues persist A/P: agreed to #60 klonopin for sparing use as applying for jobs/interviewing for situational anxiety. This may be our last visit together sadly as she and husband are making progress applying for jobs in [place]. Was a pleasure caring for patient and seeing her improve if  this is her last visit with [place]- encouraged her to quickly reestablish care in [place] if moves.   if runs out of this and still in state- would be willing to do 1 more refill before office visit  BP Readings from Last 3 Encounters:  07/06/16 130/90  05/06/16 138/84  05/06/16 120/88  asked her to monitor BP as moves- diastolic high for first time in last few visits  Meds ordered this encounter  Medications  . Linoleic Acid-Sunflower Oil [phone] MG CAPS    Sig: Take 1 capsule by mouth 3 (three) times daily with meals.  . clonazePAM (KLONOPIN) 0.5 MG tablet    Sig: Take 1 tablet (0.5 mg total) by mouth daily as needed for anxiety. Reported on 02/05/2016    Dispense:  60 tablet    Refill:  0   . buPROPion (WELLBUTRIN XL) 300 MG 24 hr tablet    Sig: Take 1 tablet (300 mg total) by mouth daily.    Dispense:  90 tablet    Refill:  3   The duration of face-to-face time during this visit was greater than 20 minutes. Greater than 50% of this time was spent in counseling future goals as transitioning to [place], importance of reestablishing care, short term prn use klonopin   Return precautions advised.  Garret Reddish, MD

## 2016-07-19 ENCOUNTER — Other Ambulatory Visit: Payer: Self-pay | Admitting: Obstetrics and Gynecology

## 2016-07-19 DIAGNOSIS — Z1239 Encounter for other screening for malignant neoplasm of breast: Secondary | ICD-10-CM

## 2016-07-20 ENCOUNTER — Ambulatory Visit (INDEPENDENT_AMBULATORY_CARE_PROVIDER_SITE_OTHER): Payer: 59

## 2016-07-20 DIAGNOSIS — Z1239 Encounter for other screening for malignant neoplasm of breast: Secondary | ICD-10-CM

## 2016-07-20 DIAGNOSIS — Z1231 Encounter for screening mammogram for malignant neoplasm of breast: Secondary | ICD-10-CM | POA: Diagnosis not present

## 2016-08-26 ENCOUNTER — Encounter: Payer: Self-pay | Admitting: Internal Medicine

## 2016-08-26 ENCOUNTER — Other Ambulatory Visit: Payer: Self-pay

## 2016-08-26 MED ORDER — THYROID 15 MG PO TABS: ORAL_TABLET | ORAL | 1 refills | Status: AC

## 2016-08-26 MED ORDER — THYROID 60 MG PO TABS: 60.0000 mg | ORAL_TABLET | Freq: Every day | ORAL | 1 refills | Status: DC

## 2016-08-31 ENCOUNTER — Encounter: Payer: Self-pay | Admitting: Internal Medicine

## 2016-09-01 ENCOUNTER — Other Ambulatory Visit: Payer: Self-pay | Admitting: Internal Medicine

## 2016-09-01 MED ORDER — ARMOUR THYROID 60 MG PO TABS: 60.0000 mg | ORAL_TABLET | Freq: Every day | ORAL | 1 refills | Status: AC

## 2016-11-23 ENCOUNTER — Other Ambulatory Visit: Payer: Self-pay | Admitting: Family Medicine

## 2016-11-23 NOTE — Telephone Encounter (Signed)
Agreed Amber Barajas- needs to establish with physician in [place]

## 2016-11-24 ENCOUNTER — Telehealth: Payer: Self-pay

## 2016-11-24 NOTE — Telephone Encounter (Signed)
Prescription request for Klonopin was printed out on 11/24/16 but voided and shredded. I called [facility] in [place] and notified them that we are shredding the prescription. I spoke with a pharmacy tech named Anderson Malta. I told her the patient needed to establish in [place]. They will notify the patient.

## 2016-11-26 ENCOUNTER — Other Ambulatory Visit: Payer: Self-pay | Admitting: Family Medicine

## 2022-07-05 ENCOUNTER — Encounter: Payer: Self-pay | Admitting: *Deleted
